# Patient Record
Sex: Male | Born: 1945 | Race: White | Hispanic: No | Marital: Married | Smoking: Never smoker
Health system: Southern US, Community
[De-identification: ages and names within clinical notes are randomized; demographics above are authoritative.]

## PROBLEM LIST (undated history)

## (undated) DIAGNOSIS — I10 Essential (primary) hypertension: Secondary | ICD-10-CM

## (undated) DIAGNOSIS — J302 Other seasonal allergic rhinitis: Secondary | ICD-10-CM

## (undated) HISTORY — PX: OTHER SURGICAL HISTORY: SHX169

---

## 2006-06-02 ENCOUNTER — Ambulatory Visit (HOSPITAL_COMMUNITY): Admission: RE | Admit: 2006-06-02 | Discharge: 2006-06-02 | Payer: Self-pay | Admitting: Family Medicine

## 2012-01-23 DIAGNOSIS — R079 Chest pain, unspecified: Secondary | ICD-10-CM

## 2012-06-01 DIAGNOSIS — H40019 Open angle with borderline findings, low risk, unspecified eye: Secondary | ICD-10-CM | POA: Diagnosis not present

## 2012-06-01 DIAGNOSIS — H2510 Age-related nuclear cataract, unspecified eye: Secondary | ICD-10-CM | POA: Diagnosis not present

## 2012-06-01 DIAGNOSIS — H40009 Preglaucoma, unspecified, unspecified eye: Secondary | ICD-10-CM | POA: Diagnosis not present

## 2013-01-25 DIAGNOSIS — I10 Essential (primary) hypertension: Secondary | ICD-10-CM | POA: Diagnosis not present

## 2013-01-25 DIAGNOSIS — E669 Obesity, unspecified: Secondary | ICD-10-CM | POA: Diagnosis not present

## 2013-01-25 DIAGNOSIS — L259 Unspecified contact dermatitis, unspecified cause: Secondary | ICD-10-CM | POA: Diagnosis not present

## 2013-01-31 DIAGNOSIS — E669 Obesity, unspecified: Secondary | ICD-10-CM | POA: Diagnosis not present

## 2013-01-31 DIAGNOSIS — I10 Essential (primary) hypertension: Secondary | ICD-10-CM | POA: Diagnosis not present

## 2013-01-31 DIAGNOSIS — R7309 Other abnormal glucose: Secondary | ICD-10-CM | POA: Diagnosis not present

## 2013-03-03 DIAGNOSIS — L570 Actinic keratosis: Secondary | ICD-10-CM | POA: Diagnosis not present

## 2013-03-03 DIAGNOSIS — L259 Unspecified contact dermatitis, unspecified cause: Secondary | ICD-10-CM | POA: Diagnosis not present

## 2013-03-22 DIAGNOSIS — I10 Essential (primary) hypertension: Secondary | ICD-10-CM | POA: Diagnosis not present

## 2013-03-22 DIAGNOSIS — E669 Obesity, unspecified: Secondary | ICD-10-CM | POA: Diagnosis not present

## 2013-03-22 DIAGNOSIS — Z23 Encounter for immunization: Secondary | ICD-10-CM | POA: Diagnosis not present

## 2013-08-08 DIAGNOSIS — I10 Essential (primary) hypertension: Secondary | ICD-10-CM | POA: Diagnosis not present

## 2013-08-08 DIAGNOSIS — IMO0001 Reserved for inherently not codable concepts without codable children: Secondary | ICD-10-CM | POA: Diagnosis not present

## 2013-08-31 DIAGNOSIS — K089 Disorder of teeth and supporting structures, unspecified: Secondary | ICD-10-CM | POA: Diagnosis not present

## 2013-08-31 DIAGNOSIS — I10 Essential (primary) hypertension: Secondary | ICD-10-CM | POA: Diagnosis not present

## 2013-11-01 DIAGNOSIS — F410 Panic disorder [episodic paroxysmal anxiety] without agoraphobia: Secondary | ICD-10-CM | POA: Diagnosis not present

## 2013-11-01 DIAGNOSIS — E669 Obesity, unspecified: Secondary | ICD-10-CM | POA: Diagnosis not present

## 2013-12-01 DIAGNOSIS — D1801 Hemangioma of skin and subcutaneous tissue: Secondary | ICD-10-CM | POA: Diagnosis not present

## 2013-12-01 DIAGNOSIS — L821 Other seborrheic keratosis: Secondary | ICD-10-CM | POA: Diagnosis not present

## 2013-12-01 DIAGNOSIS — L723 Sebaceous cyst: Secondary | ICD-10-CM | POA: Diagnosis not present

## 2014-01-31 DIAGNOSIS — I10 Essential (primary) hypertension: Secondary | ICD-10-CM | POA: Diagnosis not present

## 2014-03-09 DIAGNOSIS — L259 Unspecified contact dermatitis, unspecified cause: Secondary | ICD-10-CM | POA: Diagnosis not present

## 2014-04-16 DIAGNOSIS — Z23 Encounter for immunization: Secondary | ICD-10-CM | POA: Diagnosis not present

## 2014-04-26 DIAGNOSIS — L259 Unspecified contact dermatitis, unspecified cause: Secondary | ICD-10-CM | POA: Diagnosis not present

## 2014-04-26 DIAGNOSIS — J310 Chronic rhinitis: Secondary | ICD-10-CM | POA: Diagnosis not present

## 2014-05-05 DIAGNOSIS — S30861A Insect bite (nonvenomous) of abdominal wall, initial encounter: Secondary | ICD-10-CM | POA: Diagnosis not present

## 2014-05-05 DIAGNOSIS — W57XXXA Bitten or stung by nonvenomous insect and other nonvenomous arthropods, initial encounter: Secondary | ICD-10-CM | POA: Diagnosis not present

## 2014-05-24 DIAGNOSIS — J310 Chronic rhinitis: Secondary | ICD-10-CM | POA: Diagnosis not present

## 2014-05-24 DIAGNOSIS — L259 Unspecified contact dermatitis, unspecified cause: Secondary | ICD-10-CM | POA: Diagnosis not present

## 2014-06-21 DIAGNOSIS — I10 Essential (primary) hypertension: Secondary | ICD-10-CM | POA: Diagnosis not present

## 2014-06-21 DIAGNOSIS — E669 Obesity, unspecified: Secondary | ICD-10-CM | POA: Diagnosis not present

## 2014-06-21 DIAGNOSIS — K219 Gastro-esophageal reflux disease without esophagitis: Secondary | ICD-10-CM | POA: Diagnosis not present

## 2014-07-12 DIAGNOSIS — K0500 Acute gingivitis, plaque induced: Secondary | ICD-10-CM | POA: Diagnosis not present

## 2014-07-12 DIAGNOSIS — J069 Acute upper respiratory infection, unspecified: Secondary | ICD-10-CM | POA: Diagnosis not present

## 2015-03-21 DIAGNOSIS — R51 Headache: Secondary | ICD-10-CM | POA: Diagnosis not present

## 2015-03-21 DIAGNOSIS — Z23 Encounter for immunization: Secondary | ICD-10-CM | POA: Diagnosis not present

## 2015-03-21 DIAGNOSIS — I10 Essential (primary) hypertension: Secondary | ICD-10-CM | POA: Diagnosis not present

## 2015-10-23 DIAGNOSIS — I10 Essential (primary) hypertension: Secondary | ICD-10-CM | POA: Diagnosis not present

## 2015-10-23 DIAGNOSIS — M549 Dorsalgia, unspecified: Secondary | ICD-10-CM | POA: Diagnosis not present

## 2015-10-23 DIAGNOSIS — K5900 Constipation, unspecified: Secondary | ICD-10-CM | POA: Diagnosis not present

## 2015-10-31 DIAGNOSIS — M9903 Segmental and somatic dysfunction of lumbar region: Secondary | ICD-10-CM | POA: Diagnosis not present

## 2015-10-31 DIAGNOSIS — M9901 Segmental and somatic dysfunction of cervical region: Secondary | ICD-10-CM | POA: Diagnosis not present

## 2015-10-31 DIAGNOSIS — M9902 Segmental and somatic dysfunction of thoracic region: Secondary | ICD-10-CM | POA: Diagnosis not present

## 2015-10-31 DIAGNOSIS — I973 Postprocedural hypertension: Secondary | ICD-10-CM | POA: Diagnosis not present

## 2015-10-31 DIAGNOSIS — M5137 Other intervertebral disc degeneration, lumbosacral region: Secondary | ICD-10-CM | POA: Diagnosis not present

## 2015-11-04 DIAGNOSIS — M9901 Segmental and somatic dysfunction of cervical region: Secondary | ICD-10-CM | POA: Diagnosis not present

## 2015-11-04 DIAGNOSIS — I973 Postprocedural hypertension: Secondary | ICD-10-CM | POA: Diagnosis not present

## 2015-11-04 DIAGNOSIS — M9903 Segmental and somatic dysfunction of lumbar region: Secondary | ICD-10-CM | POA: Diagnosis not present

## 2015-11-04 DIAGNOSIS — M5137 Other intervertebral disc degeneration, lumbosacral region: Secondary | ICD-10-CM | POA: Diagnosis not present

## 2015-11-04 DIAGNOSIS — M9902 Segmental and somatic dysfunction of thoracic region: Secondary | ICD-10-CM | POA: Diagnosis not present

## 2015-11-06 DIAGNOSIS — I973 Postprocedural hypertension: Secondary | ICD-10-CM | POA: Diagnosis not present

## 2015-11-06 DIAGNOSIS — M9903 Segmental and somatic dysfunction of lumbar region: Secondary | ICD-10-CM | POA: Diagnosis not present

## 2015-11-06 DIAGNOSIS — M5137 Other intervertebral disc degeneration, lumbosacral region: Secondary | ICD-10-CM | POA: Diagnosis not present

## 2015-11-06 DIAGNOSIS — M9901 Segmental and somatic dysfunction of cervical region: Secondary | ICD-10-CM | POA: Diagnosis not present

## 2015-11-06 DIAGNOSIS — M9902 Segmental and somatic dysfunction of thoracic region: Secondary | ICD-10-CM | POA: Diagnosis not present

## 2015-11-07 DIAGNOSIS — M9903 Segmental and somatic dysfunction of lumbar region: Secondary | ICD-10-CM | POA: Diagnosis not present

## 2015-11-07 DIAGNOSIS — M9902 Segmental and somatic dysfunction of thoracic region: Secondary | ICD-10-CM | POA: Diagnosis not present

## 2015-11-07 DIAGNOSIS — M9901 Segmental and somatic dysfunction of cervical region: Secondary | ICD-10-CM | POA: Diagnosis not present

## 2015-11-07 DIAGNOSIS — I973 Postprocedural hypertension: Secondary | ICD-10-CM | POA: Diagnosis not present

## 2015-11-07 DIAGNOSIS — M5137 Other intervertebral disc degeneration, lumbosacral region: Secondary | ICD-10-CM | POA: Diagnosis not present

## 2015-11-12 DIAGNOSIS — M9901 Segmental and somatic dysfunction of cervical region: Secondary | ICD-10-CM | POA: Diagnosis not present

## 2015-11-12 DIAGNOSIS — M9902 Segmental and somatic dysfunction of thoracic region: Secondary | ICD-10-CM | POA: Diagnosis not present

## 2015-11-12 DIAGNOSIS — I973 Postprocedural hypertension: Secondary | ICD-10-CM | POA: Diagnosis not present

## 2015-11-12 DIAGNOSIS — M5137 Other intervertebral disc degeneration, lumbosacral region: Secondary | ICD-10-CM | POA: Diagnosis not present

## 2015-11-12 DIAGNOSIS — M9903 Segmental and somatic dysfunction of lumbar region: Secondary | ICD-10-CM | POA: Diagnosis not present

## 2015-11-13 DIAGNOSIS — M5127 Other intervertebral disc displacement, lumbosacral region: Secondary | ICD-10-CM | POA: Diagnosis not present

## 2015-11-13 DIAGNOSIS — M9903 Segmental and somatic dysfunction of lumbar region: Secondary | ICD-10-CM | POA: Diagnosis not present

## 2015-11-13 DIAGNOSIS — M9902 Segmental and somatic dysfunction of thoracic region: Secondary | ICD-10-CM | POA: Diagnosis not present

## 2015-11-13 DIAGNOSIS — M9904 Segmental and somatic dysfunction of sacral region: Secondary | ICD-10-CM | POA: Diagnosis not present

## 2015-11-13 DIAGNOSIS — M4317 Spondylolisthesis, lumbosacral region: Secondary | ICD-10-CM | POA: Diagnosis not present

## 2015-11-13 DIAGNOSIS — M5135 Other intervertebral disc degeneration, thoracolumbar region: Secondary | ICD-10-CM | POA: Diagnosis not present

## 2015-11-15 DIAGNOSIS — M5135 Other intervertebral disc degeneration, thoracolumbar region: Secondary | ICD-10-CM | POA: Diagnosis not present

## 2015-11-15 DIAGNOSIS — M9903 Segmental and somatic dysfunction of lumbar region: Secondary | ICD-10-CM | POA: Diagnosis not present

## 2015-11-15 DIAGNOSIS — M4317 Spondylolisthesis, lumbosacral region: Secondary | ICD-10-CM | POA: Diagnosis not present

## 2015-11-15 DIAGNOSIS — M9904 Segmental and somatic dysfunction of sacral region: Secondary | ICD-10-CM | POA: Diagnosis not present

## 2015-11-15 DIAGNOSIS — M9902 Segmental and somatic dysfunction of thoracic region: Secondary | ICD-10-CM | POA: Diagnosis not present

## 2015-11-15 DIAGNOSIS — M5127 Other intervertebral disc displacement, lumbosacral region: Secondary | ICD-10-CM | POA: Diagnosis not present

## 2015-11-18 DIAGNOSIS — M9903 Segmental and somatic dysfunction of lumbar region: Secondary | ICD-10-CM | POA: Diagnosis not present

## 2015-11-18 DIAGNOSIS — M5135 Other intervertebral disc degeneration, thoracolumbar region: Secondary | ICD-10-CM | POA: Diagnosis not present

## 2015-11-18 DIAGNOSIS — M9902 Segmental and somatic dysfunction of thoracic region: Secondary | ICD-10-CM | POA: Diagnosis not present

## 2015-11-18 DIAGNOSIS — M5127 Other intervertebral disc displacement, lumbosacral region: Secondary | ICD-10-CM | POA: Diagnosis not present

## 2015-11-18 DIAGNOSIS — M4317 Spondylolisthesis, lumbosacral region: Secondary | ICD-10-CM | POA: Diagnosis not present

## 2015-11-18 DIAGNOSIS — M9904 Segmental and somatic dysfunction of sacral region: Secondary | ICD-10-CM | POA: Diagnosis not present

## 2015-11-19 DIAGNOSIS — I10 Essential (primary) hypertension: Secondary | ICD-10-CM | POA: Diagnosis not present

## 2015-11-19 DIAGNOSIS — L089 Local infection of the skin and subcutaneous tissue, unspecified: Secondary | ICD-10-CM | POA: Diagnosis not present

## 2015-11-22 DIAGNOSIS — M9902 Segmental and somatic dysfunction of thoracic region: Secondary | ICD-10-CM | POA: Diagnosis not present

## 2015-11-22 DIAGNOSIS — M9903 Segmental and somatic dysfunction of lumbar region: Secondary | ICD-10-CM | POA: Diagnosis not present

## 2015-11-22 DIAGNOSIS — M4317 Spondylolisthesis, lumbosacral region: Secondary | ICD-10-CM | POA: Diagnosis not present

## 2015-11-22 DIAGNOSIS — M5127 Other intervertebral disc displacement, lumbosacral region: Secondary | ICD-10-CM | POA: Diagnosis not present

## 2015-11-22 DIAGNOSIS — M9904 Segmental and somatic dysfunction of sacral region: Secondary | ICD-10-CM | POA: Diagnosis not present

## 2015-11-22 DIAGNOSIS — M5135 Other intervertebral disc degeneration, thoracolumbar region: Secondary | ICD-10-CM | POA: Diagnosis not present

## 2015-11-25 DIAGNOSIS — M5135 Other intervertebral disc degeneration, thoracolumbar region: Secondary | ICD-10-CM | POA: Diagnosis not present

## 2015-11-25 DIAGNOSIS — M9904 Segmental and somatic dysfunction of sacral region: Secondary | ICD-10-CM | POA: Diagnosis not present

## 2015-11-25 DIAGNOSIS — M5127 Other intervertebral disc displacement, lumbosacral region: Secondary | ICD-10-CM | POA: Diagnosis not present

## 2015-11-25 DIAGNOSIS — M9903 Segmental and somatic dysfunction of lumbar region: Secondary | ICD-10-CM | POA: Diagnosis not present

## 2015-11-25 DIAGNOSIS — M9902 Segmental and somatic dysfunction of thoracic region: Secondary | ICD-10-CM | POA: Diagnosis not present

## 2015-11-25 DIAGNOSIS — M4317 Spondylolisthesis, lumbosacral region: Secondary | ICD-10-CM | POA: Diagnosis not present

## 2015-11-28 ENCOUNTER — Encounter (INDEPENDENT_AMBULATORY_CARE_PROVIDER_SITE_OTHER): Payer: Self-pay | Admitting: *Deleted

## 2015-11-28 DIAGNOSIS — I10 Essential (primary) hypertension: Secondary | ICD-10-CM | POA: Diagnosis not present

## 2015-11-28 DIAGNOSIS — K5900 Constipation, unspecified: Secondary | ICD-10-CM | POA: Diagnosis not present

## 2015-11-28 DIAGNOSIS — R220 Localized swelling, mass and lump, head: Secondary | ICD-10-CM | POA: Diagnosis not present

## 2015-11-29 DIAGNOSIS — M4317 Spondylolisthesis, lumbosacral region: Secondary | ICD-10-CM | POA: Diagnosis not present

## 2015-11-29 DIAGNOSIS — M5127 Other intervertebral disc displacement, lumbosacral region: Secondary | ICD-10-CM | POA: Diagnosis not present

## 2015-11-29 DIAGNOSIS — M9903 Segmental and somatic dysfunction of lumbar region: Secondary | ICD-10-CM | POA: Diagnosis not present

## 2015-11-29 DIAGNOSIS — M9902 Segmental and somatic dysfunction of thoracic region: Secondary | ICD-10-CM | POA: Diagnosis not present

## 2015-11-29 DIAGNOSIS — M5135 Other intervertebral disc degeneration, thoracolumbar region: Secondary | ICD-10-CM | POA: Diagnosis not present

## 2015-11-29 DIAGNOSIS — M9904 Segmental and somatic dysfunction of sacral region: Secondary | ICD-10-CM | POA: Diagnosis not present

## 2015-12-02 DIAGNOSIS — M5135 Other intervertebral disc degeneration, thoracolumbar region: Secondary | ICD-10-CM | POA: Diagnosis not present

## 2015-12-02 DIAGNOSIS — M9902 Segmental and somatic dysfunction of thoracic region: Secondary | ICD-10-CM | POA: Diagnosis not present

## 2015-12-02 DIAGNOSIS — M5127 Other intervertebral disc displacement, lumbosacral region: Secondary | ICD-10-CM | POA: Diagnosis not present

## 2015-12-02 DIAGNOSIS — M4317 Spondylolisthesis, lumbosacral region: Secondary | ICD-10-CM | POA: Diagnosis not present

## 2015-12-02 DIAGNOSIS — M9903 Segmental and somatic dysfunction of lumbar region: Secondary | ICD-10-CM | POA: Diagnosis not present

## 2015-12-02 DIAGNOSIS — M9904 Segmental and somatic dysfunction of sacral region: Secondary | ICD-10-CM | POA: Diagnosis not present

## 2015-12-06 DIAGNOSIS — M9903 Segmental and somatic dysfunction of lumbar region: Secondary | ICD-10-CM | POA: Diagnosis not present

## 2015-12-06 DIAGNOSIS — M5135 Other intervertebral disc degeneration, thoracolumbar region: Secondary | ICD-10-CM | POA: Diagnosis not present

## 2015-12-06 DIAGNOSIS — M9904 Segmental and somatic dysfunction of sacral region: Secondary | ICD-10-CM | POA: Diagnosis not present

## 2015-12-06 DIAGNOSIS — M9902 Segmental and somatic dysfunction of thoracic region: Secondary | ICD-10-CM | POA: Diagnosis not present

## 2015-12-06 DIAGNOSIS — M4317 Spondylolisthesis, lumbosacral region: Secondary | ICD-10-CM | POA: Diagnosis not present

## 2015-12-06 DIAGNOSIS — M5127 Other intervertebral disc displacement, lumbosacral region: Secondary | ICD-10-CM | POA: Diagnosis not present

## 2015-12-09 DIAGNOSIS — M9904 Segmental and somatic dysfunction of sacral region: Secondary | ICD-10-CM | POA: Diagnosis not present

## 2015-12-09 DIAGNOSIS — M9902 Segmental and somatic dysfunction of thoracic region: Secondary | ICD-10-CM | POA: Diagnosis not present

## 2015-12-09 DIAGNOSIS — M5127 Other intervertebral disc displacement, lumbosacral region: Secondary | ICD-10-CM | POA: Diagnosis not present

## 2015-12-09 DIAGNOSIS — M9903 Segmental and somatic dysfunction of lumbar region: Secondary | ICD-10-CM | POA: Diagnosis not present

## 2015-12-09 DIAGNOSIS — M4317 Spondylolisthesis, lumbosacral region: Secondary | ICD-10-CM | POA: Diagnosis not present

## 2015-12-09 DIAGNOSIS — M5135 Other intervertebral disc degeneration, thoracolumbar region: Secondary | ICD-10-CM | POA: Diagnosis not present

## 2015-12-12 ENCOUNTER — Other Ambulatory Visit (INDEPENDENT_AMBULATORY_CARE_PROVIDER_SITE_OTHER): Payer: Self-pay | Admitting: *Deleted

## 2015-12-12 DIAGNOSIS — Z1211 Encounter for screening for malignant neoplasm of colon: Secondary | ICD-10-CM

## 2015-12-13 DIAGNOSIS — M5135 Other intervertebral disc degeneration, thoracolumbar region: Secondary | ICD-10-CM | POA: Diagnosis not present

## 2015-12-13 DIAGNOSIS — M9904 Segmental and somatic dysfunction of sacral region: Secondary | ICD-10-CM | POA: Diagnosis not present

## 2015-12-13 DIAGNOSIS — M5127 Other intervertebral disc displacement, lumbosacral region: Secondary | ICD-10-CM | POA: Diagnosis not present

## 2015-12-13 DIAGNOSIS — M9903 Segmental and somatic dysfunction of lumbar region: Secondary | ICD-10-CM | POA: Diagnosis not present

## 2015-12-13 DIAGNOSIS — M4317 Spondylolisthesis, lumbosacral region: Secondary | ICD-10-CM | POA: Diagnosis not present

## 2015-12-13 DIAGNOSIS — M9902 Segmental and somatic dysfunction of thoracic region: Secondary | ICD-10-CM | POA: Diagnosis not present

## 2015-12-19 DIAGNOSIS — M9903 Segmental and somatic dysfunction of lumbar region: Secondary | ICD-10-CM | POA: Diagnosis not present

## 2015-12-19 DIAGNOSIS — M4317 Spondylolisthesis, lumbosacral region: Secondary | ICD-10-CM | POA: Diagnosis not present

## 2015-12-19 DIAGNOSIS — M9904 Segmental and somatic dysfunction of sacral region: Secondary | ICD-10-CM | POA: Diagnosis not present

## 2015-12-19 DIAGNOSIS — M5135 Other intervertebral disc degeneration, thoracolumbar region: Secondary | ICD-10-CM | POA: Diagnosis not present

## 2015-12-19 DIAGNOSIS — M5127 Other intervertebral disc displacement, lumbosacral region: Secondary | ICD-10-CM | POA: Diagnosis not present

## 2015-12-19 DIAGNOSIS — M9902 Segmental and somatic dysfunction of thoracic region: Secondary | ICD-10-CM | POA: Diagnosis not present

## 2015-12-24 DIAGNOSIS — L723 Sebaceous cyst: Secondary | ICD-10-CM | POA: Diagnosis not present

## 2015-12-25 DIAGNOSIS — M5127 Other intervertebral disc displacement, lumbosacral region: Secondary | ICD-10-CM | POA: Diagnosis not present

## 2015-12-25 DIAGNOSIS — M5135 Other intervertebral disc degeneration, thoracolumbar region: Secondary | ICD-10-CM | POA: Diagnosis not present

## 2015-12-25 DIAGNOSIS — M9904 Segmental and somatic dysfunction of sacral region: Secondary | ICD-10-CM | POA: Diagnosis not present

## 2015-12-25 DIAGNOSIS — M4317 Spondylolisthesis, lumbosacral region: Secondary | ICD-10-CM | POA: Diagnosis not present

## 2015-12-25 DIAGNOSIS — M9903 Segmental and somatic dysfunction of lumbar region: Secondary | ICD-10-CM | POA: Diagnosis not present

## 2015-12-25 DIAGNOSIS — M9902 Segmental and somatic dysfunction of thoracic region: Secondary | ICD-10-CM | POA: Diagnosis not present

## 2016-01-01 DIAGNOSIS — M9904 Segmental and somatic dysfunction of sacral region: Secondary | ICD-10-CM | POA: Diagnosis not present

## 2016-01-01 DIAGNOSIS — M9902 Segmental and somatic dysfunction of thoracic region: Secondary | ICD-10-CM | POA: Diagnosis not present

## 2016-01-01 DIAGNOSIS — M9903 Segmental and somatic dysfunction of lumbar region: Secondary | ICD-10-CM | POA: Diagnosis not present

## 2016-01-01 DIAGNOSIS — M5135 Other intervertebral disc degeneration, thoracolumbar region: Secondary | ICD-10-CM | POA: Diagnosis not present

## 2016-01-01 DIAGNOSIS — M4317 Spondylolisthesis, lumbosacral region: Secondary | ICD-10-CM | POA: Diagnosis not present

## 2016-01-01 DIAGNOSIS — M5127 Other intervertebral disc displacement, lumbosacral region: Secondary | ICD-10-CM | POA: Diagnosis not present

## 2016-01-07 DIAGNOSIS — M4317 Spondylolisthesis, lumbosacral region: Secondary | ICD-10-CM | POA: Diagnosis not present

## 2016-01-07 DIAGNOSIS — M9902 Segmental and somatic dysfunction of thoracic region: Secondary | ICD-10-CM | POA: Diagnosis not present

## 2016-01-07 DIAGNOSIS — M9903 Segmental and somatic dysfunction of lumbar region: Secondary | ICD-10-CM | POA: Diagnosis not present

## 2016-01-07 DIAGNOSIS — M9904 Segmental and somatic dysfunction of sacral region: Secondary | ICD-10-CM | POA: Diagnosis not present

## 2016-01-07 DIAGNOSIS — M5135 Other intervertebral disc degeneration, thoracolumbar region: Secondary | ICD-10-CM | POA: Diagnosis not present

## 2016-01-07 DIAGNOSIS — M5127 Other intervertebral disc displacement, lumbosacral region: Secondary | ICD-10-CM | POA: Diagnosis not present

## 2016-01-14 DIAGNOSIS — M5127 Other intervertebral disc displacement, lumbosacral region: Secondary | ICD-10-CM | POA: Diagnosis not present

## 2016-01-14 DIAGNOSIS — M9904 Segmental and somatic dysfunction of sacral region: Secondary | ICD-10-CM | POA: Diagnosis not present

## 2016-01-14 DIAGNOSIS — M4317 Spondylolisthesis, lumbosacral region: Secondary | ICD-10-CM | POA: Diagnosis not present

## 2016-01-14 DIAGNOSIS — M9902 Segmental and somatic dysfunction of thoracic region: Secondary | ICD-10-CM | POA: Diagnosis not present

## 2016-01-14 DIAGNOSIS — M5135 Other intervertebral disc degeneration, thoracolumbar region: Secondary | ICD-10-CM | POA: Diagnosis not present

## 2016-01-14 DIAGNOSIS — M9903 Segmental and somatic dysfunction of lumbar region: Secondary | ICD-10-CM | POA: Diagnosis not present

## 2016-01-29 DIAGNOSIS — I10 Essential (primary) hypertension: Secondary | ICD-10-CM | POA: Diagnosis not present

## 2016-01-29 DIAGNOSIS — E785 Hyperlipidemia, unspecified: Secondary | ICD-10-CM | POA: Diagnosis not present

## 2016-01-29 DIAGNOSIS — Z6835 Body mass index (BMI) 35.0-35.9, adult: Secondary | ICD-10-CM | POA: Diagnosis not present

## 2016-01-31 DIAGNOSIS — L853 Xerosis cutis: Secondary | ICD-10-CM | POA: Diagnosis not present

## 2016-01-31 DIAGNOSIS — L570 Actinic keratosis: Secondary | ICD-10-CM | POA: Diagnosis not present

## 2016-01-31 DIAGNOSIS — D692 Other nonthrombocytopenic purpura: Secondary | ICD-10-CM | POA: Diagnosis not present

## 2016-02-04 DIAGNOSIS — H401130 Primary open-angle glaucoma, bilateral, stage unspecified: Secondary | ICD-10-CM | POA: Diagnosis not present

## 2016-02-04 DIAGNOSIS — H2513 Age-related nuclear cataract, bilateral: Secondary | ICD-10-CM | POA: Diagnosis not present

## 2016-02-20 ENCOUNTER — Telehealth (INDEPENDENT_AMBULATORY_CARE_PROVIDER_SITE_OTHER): Payer: Self-pay | Admitting: *Deleted

## 2016-02-20 ENCOUNTER — Encounter (INDEPENDENT_AMBULATORY_CARE_PROVIDER_SITE_OTHER): Payer: Self-pay | Admitting: *Deleted

## 2016-02-20 NOTE — Telephone Encounter (Signed)
Patient needs trilyte 

## 2016-02-21 MED ORDER — PEG 3350-KCL-NA BICARB-NACL 420 G PO SOLR: 4000.0000 mL | Freq: Once | ORAL | 0 refills | Status: AC

## 2016-03-05 ENCOUNTER — Telehealth (INDEPENDENT_AMBULATORY_CARE_PROVIDER_SITE_OTHER): Payer: Self-pay | Admitting: *Deleted

## 2016-03-05 NOTE — Telephone Encounter (Signed)
Referring MD/PCP: fanta   Procedure: tcs  Reason/Indication:  screening  Has patient had this procedure before?  no  If so, when, by whom and where?    Is there a family history of colon cancer?  no  Who?  What age when diagnosed?    Is patient diabetic?   no      Does patient have prosthetic heart valve or mechanical valve?  no  Do you have a pacemaker?  no  Has patient ever had endocarditis? no  Has patient had joint replacement within last 12 months?  no  Does patient tend to be constipated or take laxatives? no  Does patient have a history of alcohol/drug use?  no  Is patient on Coumadin, Plavix and/or Aspirin? no  Medications: quinapril 10 mg daily  Allergies: nkda  Medication Adjustment:   Procedure date & time: 04/01/16 at 730

## 2016-03-06 NOTE — Telephone Encounter (Signed)
agree

## 2016-03-30 ENCOUNTER — Other Ambulatory Visit (INDEPENDENT_AMBULATORY_CARE_PROVIDER_SITE_OTHER): Payer: Self-pay | Admitting: *Deleted

## 2016-03-30 DIAGNOSIS — Z1211 Encounter for screening for malignant neoplasm of colon: Secondary | ICD-10-CM

## 2016-04-01 ENCOUNTER — Ambulatory Visit (HOSPITAL_COMMUNITY)
Admission: RE | Admit: 2016-04-01 | Discharge: 2016-04-01 | Disposition: A | Discharge status: Home / Self Care | Payer: Medicare Other | Source: Ambulatory Visit | Attending: Internal Medicine | Admitting: Internal Medicine

## 2016-04-01 ENCOUNTER — Encounter (HOSPITAL_COMMUNITY)
Admission: RE | Disposition: A | Discharge status: Home / Self Care | Payer: Self-pay | Source: Ambulatory Visit | Attending: Internal Medicine

## 2016-04-01 ENCOUNTER — Encounter (HOSPITAL_COMMUNITY): Payer: Self-pay | Admitting: *Deleted

## 2016-04-01 DIAGNOSIS — K5730 Diverticulosis of large intestine without perforation or abscess without bleeding: Secondary | ICD-10-CM | POA: Insufficient documentation

## 2016-04-01 DIAGNOSIS — Z79899 Other long term (current) drug therapy: Secondary | ICD-10-CM | POA: Insufficient documentation

## 2016-04-01 DIAGNOSIS — K644 Residual hemorrhoidal skin tags: Secondary | ICD-10-CM | POA: Insufficient documentation

## 2016-04-01 DIAGNOSIS — Z1211 Encounter for screening for malignant neoplasm of colon: Secondary | ICD-10-CM | POA: Diagnosis not present

## 2016-04-01 HISTORY — PX: COLONOSCOPY: SHX5424

## 2016-04-01 HISTORY — DX: Other seasonal allergic rhinitis: J30.2

## 2016-04-01 SURGERY — COLONOSCOPY
Anesthesia: Moderate Sedation

## 2016-04-01 MED ORDER — MIDAZOLAM HCL 5 MG/5ML IJ SOLN
INTRAMUSCULAR | Status: DC | PRN
  Administered 2016-04-01 (×2): 2 mg via INTRAVENOUS
  Administered 2016-04-01: 3 mg via INTRAVENOUS
  Administered 2016-04-01: 2 mg via INTRAVENOUS

## 2016-04-01 MED ORDER — MIDAZOLAM HCL 5 MG/5ML IJ SOLN
INTRAMUSCULAR | Status: AC
  Filled 2016-04-01: qty 10

## 2016-04-01 MED ORDER — MEPERIDINE HCL 50 MG/ML IJ SOLN
INTRAMUSCULAR | Status: AC
  Filled 2016-04-01: qty 1

## 2016-04-01 MED ORDER — SODIUM CHLORIDE 0.9 % IV SOLN
INTRAVENOUS | Status: DC
  Administered 2016-04-01: 08:00:00 via INTRAVENOUS

## 2016-04-01 MED ORDER — MEPERIDINE HCL 50 MG/ML IJ SOLN
INTRAMUSCULAR | Status: DC | PRN
  Administered 2016-04-01 (×2): 25 mg

## 2016-04-01 NOTE — Discharge Instructions (Signed)
Resume usual medications and high fiber diet. No driving for 24 hours. Next screening exam in 10 years.   Colonoscopy, Care After These instructions give you information on caring for yourself after your procedure. Your doctor may also give you more specific instructions. Call your doctor if you have any problems or questions after your procedure. HOME CARE  Do not drive for 24 hours.  Do not sign important papers or use machinery for 24 hours.  You may shower.  You may go back to your usual activities, but go slower for the first 24 hours.  Take rest breaks often during the first 24 hours.  Walk around or use warm packs on your belly (abdomen) if you have belly cramping or gas.  Drink enough fluids to keep your pee (urine) clear or pale yellow.  Resume your normal diet. Avoid heavy or fried foods.  Avoid drinking alcohol for 24 hours or as told by your doctor.  Only take medicines as told by your doctor. If a tissue sample (biopsy) was taken during the procedure:   Do not take aspirin or blood thinners for 7 days, or as told by your doctor.  Do not drink alcohol for 7 days, or as told by your doctor.  Eat soft foods for the first 24 hours. GET HELP IF: You still have a small amount of blood in your poop (stool) 2-3 days after the procedure. GET HELP RIGHT AWAY IF:  You have more than a small amount of blood in your poop.  You see clumps of tissue (blood clots) in your poop.  Your belly is puffy (swollen).  You feel sick to your stomach (nauseous) or throw up (vomit).  You have a fever.  You have belly pain that gets worse and medicine does not help. MAKE SURE YOU:  Understand these instructions.  Will watch your condition.  Will get help right away if you are not doing well or get worse.   This information is not intended to replace advice given to you by your health care provider. Make sure you discuss any questions you have with your health care  provider.   Document Released: 07/04/2010 Document Revised: 06/06/2013 Document Reviewed: 02/06/2013 Elsevier Interactive Patient Education 2016 [employer].    Hemorrhoids Hemorrhoids are swollen veins around the rectum or anus. There are two types of hemorrhoids:   Internal hemorrhoids. These occur in the veins just inside the rectum. They may poke through to the outside and become irritated and painful.  External hemorrhoids. These occur in the veins outside the anus and can be felt as a painful swelling or hard lump near the anus. CAUSES  Pregnancy.   Obesity.   Constipation or diarrhea.   Straining to have a bowel movement.   Sitting for long periods on the toilet.  Heavy lifting or other activity that caused you to strain.  Anal intercourse. SYMPTOMS   Pain.   Anal itching or irritation.   Rectal bleeding.   Fecal leakage.   Anal swelling.   One or more lumps around the anus.  DIAGNOSIS  Your caregiver may be able to diagnose hemorrhoids by visual examination. Other examinations or tests that may be performed include:   Examination of the rectal area with a gloved hand (digital rectal exam).   Examination of anal canal using a small tube (scope).   A blood test if you have lost a significant amount of blood.  A test to look inside the colon (sigmoidoscopy or colonoscopy).  TREATMENT Most hemorrhoids can be treated at home. However, if symptoms do not seem to be getting better or if you have a lot of rectal bleeding, your caregiver may perform a procedure to help make the hemorrhoids get smaller or remove them completely. Possible treatments include:   Placing a rubber band at the base of the hemorrhoid to cut off the circulation (rubber band ligation).   Injecting a chemical to shrink the hemorrhoid (sclerotherapy).   Using a tool to burn the hemorrhoid (infrared light therapy).   Surgically removing the hemorrhoid (hemorrhoidectomy).    Stapling the hemorrhoid to block blood flow to the tissue (hemorrhoid stapling).  HOME CARE INSTRUCTIONS   Eat foods with fiber, such as whole grains, beans, nuts, fruits, and vegetables. Ask your doctor about taking products with added fiber in them (fibersupplements).  Increase fluid intake. Drink enough water and fluids to keep your urine clear or pale yellow.   Exercise regularly.   Go to the bathroom when you have the urge to have a bowel movement. Do not wait.   Avoid straining to have bowel movements.   Keep the anal area dry and clean. Use wet toilet paper or moist towelettes after a bowel movement.   Medicated creams and suppositories may be used or applied as directed.   Only take over-the-counter or prescription medicines as directed by your caregiver.   Take warm sitz baths for 15-20 minutes, 3-4 times a day to ease pain and discomfort.   Place ice packs on the hemorrhoids if they are tender and swollen. Using ice packs between sitz baths may be helpful.   Put ice in a plastic bag.   Place a towel between your skin and the bag.   Leave the ice on for 15-20 minutes, 3-4 times a day.   Do not use a donut-shaped pillow or sit on the toilet for long periods. This increases blood pooling and pain.  SEEK MEDICAL CARE IF:  You have increasing pain and swelling that is not controlled by treatment or medicine.  You have uncontrolled bleeding.  You have difficulty or you are unable to have a bowel movement.  You have pain or inflammation outside the area of the hemorrhoids. MAKE SURE YOU:  Understand these instructions.  Will watch your condition.  Will get help right away if you are not doing well or get worse.   This information is not intended to replace advice given to you by your health care provider. Make sure you discuss any questions you have with your health care provider.   Document Released: 05/29/2000 Document Revised: 05/18/2012  Document Reviewed: 04/05/2012 Elsevier Interactive Patient Education 2016 [employer].   Diverticulosis Diverticulosis is the condition that develops when small pouches (diverticula) form in the wall of your colon. Your colon, or large intestine, is where water is absorbed and stool is formed. The pouches form when the inside layer of your colon pushes through weak spots in the outer layers of your colon. CAUSES  No one knows exactly what causes diverticulosis. RISK FACTORS  Being older than 38. Your risk for this condition increases with age. Diverticulosis is rare in people younger than 40 years. By age 55, almost everyone has it.  Eating a low-fiber diet.  Being frequently constipated.  Being overweight.  Not getting enough exercise.  Smoking.  Taking over-the-counter pain medicines, like aspirin and ibuprofen. SYMPTOMS  Most people with diverticulosis do not have symptoms. DIAGNOSIS  Because diverticulosis often has no symptoms, health  care providers often discover the condition during an exam for other colon problems. In many cases, a health care provider will diagnose diverticulosis while using a flexible scope to examine the colon (colonoscopy). TREATMENT  If you have never developed an infection related to diverticulosis, you may not need treatment. If you have had an infection before, treatment may include:  Eating more fruits, vegetables, and grains.  Taking a fiber supplement.  Taking a live bacteria supplement (probiotic).  Taking medicine to relax your colon. HOME CARE INSTRUCTIONS   Drink at least 6-8 glasses of water each day to prevent constipation.  Try not to strain when you have a bowel movement.  Keep all follow-up appointments. If you have had an infection before:  Increase the fiber in your diet as directed by your health care provider or dietitian.  Take a dietary fiber supplement if your health care provider approves.  Only take medicines  as directed by your health care provider. SEEK MEDICAL CARE IF:   You have abdominal pain.  You have bloating.  You have cramps.  You have not gone to the bathroom in 3 days. SEEK IMMEDIATE MEDICAL CARE IF:   Your pain gets worse.  Yourbloating becomes very bad.  You have a fever or chills, and your symptoms suddenly get worse.  You begin vomiting.  You have bowel movements that are bloody or black. MAKE SURE YOU:  Understand these instructions.  Will watch your condition.  Will get help right away if you are not doing well or get worse.   This information is not intended to replace advice given to you by your health care provider. Make sure you discuss any questions you have with your health care provider.   Document Released: 02/27/2004 Document Revised: 06/06/2013 Document Reviewed: 04/26/2013 Elsevier Interactive Patient Education [employer].

## 2016-04-01 NOTE — Op Note (Signed)
[facility] Patient Name: Johnny Gates Procedure Date: 04/01/2016 8:18 AM MRN: [id number] Date of Birth: [birth year] Attending MD: Hildred Laser , MD CSN: [id number] Age: 70 Admit Type: Outpatient Procedure:                Colonoscopy Indications:              Screening for colorectal malignant neoplasm Providers:                Hildred Laser, MD, Janeece Riggers, RN, Isabella Stalling,                            Technician Referring MD:             Rosita Fire, MD Medicines:                Meperidine 50 mg IV, Midazolam 8 mg IV Complications:            No immediate complications. Estimated Blood Loss:     Estimated blood loss: none. Procedure:                Pre-Anesthesia Assessment:                           - Prior to the procedure, a History and Physical                            was performed, and patient medications and                            allergies were reviewed. The patient's tolerance of                            previous anesthesia was also reviewed. The risks                            and benefits of the procedure and the sedation                            options and risks were discussed with the patient.                            All questions were answered, and informed consent                            was obtained. Prior Anticoagulants: The patient has                            taken no previous anticoagulant or antiplatelet                            agents. ASA Grade Assessment: I - A normal, healthy                            patient. After reviewing the risks and benefits,  the patient was deemed in satisfactory condition to                            undergo the procedure.                           After obtaining informed consent, the colonoscope                            was passed under direct vision. Throughout the                            procedure, the patient's blood pressure, pulse, and        oxygen saturations were monitored continuously. The                            EC-[id number]) scope was introduced through                            the anus and advanced to the the cecum, identified                            by appendiceal orifice and ileocecal valve. The                            colonoscopy was performed without difficulty. The                            patient tolerated the procedure well. The quality                            of the bowel preparation was adequate. The                            ileocecal valve, appendiceal orifice, and rectum                            were photographed. Scope In: 8:35:06 AM Scope Out: 8:56:32 AM Scope Withdrawal Time: 0 hours 8 minutes 26 seconds  Total Procedure Duration: 0 hours 21 minutes 26 seconds  Findings:      A few medium-mouthed diverticula were found in the sigmoid colon.      External hemorrhoids were found during retroflexion. The hemorrhoids       were medium-sized. Impression:               - Diverticulosis in the sigmoid colon.                           - External hemorrhoids.                           - No specimens collected. Moderate Sedation:      Moderate (conscious) sedation was administered by the endoscopy nurse       and supervised by the endoscopist. The following parameters were       monitored:  oxygen saturation, heart rate, blood pressure, CO2       capnography and response to care. Total physician intraservice time was       33 minutes. Recommendation:           - Patient has a contact number available for                            emergencies. The signs and symptoms of potential                            delayed complications were discussed with the                            patient. Return to normal activities tomorrow.                            Written discharge instructions were provided to the                            patient.                           - High fiber diet  today.                           - Continue present medications.                           - Repeat colonoscopy in 10 years for screening                            purposes. Procedure Code(s):        --- Professional ---                           [phone], Colonoscopy, flexible; diagnostic, including                            collection of specimen(s) by brushing or washing,                            when performed (separate procedure)                           99152, Moderate sedation services provided by the                            same physician or other qualified health care                            professional performing the diagnostic or                            therapeutic service that the sedation supports,  requiring the presence of an independent trained                            observer to assist in the monitoring of the                            patient's level of consciousness and physiological                            status; initial 15 minutes of intraservice time,                            patient age 75 years or older                           [phone], Moderate sedation services; each additional                            15 minutes intraservice time Diagnosis Code(s):        --- Professional ---                           Z12.11, Encounter for screening for malignant                            neoplasm of colon                           K64.4, Residual hemorrhoidal skin tags                           K57.30, Diverticulosis of large intestine without                            perforation or abscess without bleeding CPT copyright 2016 American Medical Association. All rights reserved. The codes documented in this report are preliminary and upon coder review may  be revised to meet current compliance requirements. Hildred Laser, MD Hildred Laser, MD 04/01/2016 9:03:34 AM This report has been signed electronically. Number of Addenda:  0

## 2016-04-01 NOTE — H&P (Addendum)
Johnny Gates is an 70 y.o. male.   Chief Complaint: Patient is here for colonoscopy. HPI: Patient is 70 year old Caucasian male who is here for screening colonoscopy. She is patient's first exam. He denies abdominal pain change in bowel habits of frank rectal bleeding. He has occasional hematochezia felt to be secondary to hemorrhoids. Family history is negative for CRC.  Past Medical History:  Diagnosis Date  . Seasonal allergies     Past Surgical History:  Procedure Laterality Date  . Left shoulder surgery      Family History  Problem Relation Age of Onset  . Colon cancer Neg Hx    Social History:  reports that he has never smoked. He has never used smokeless tobacco. He reports that he does not drink alcohol or use drugs.  Allergies: No Known Allergies  Medications Prior to Admission  Medication Sig Dispense Refill  . acetaminophen (TYLENOL) 500 MG tablet Take 500-1,000 mg by mouth every 6 (six) hours as needed (for pain.).    Marland Kitchen Ascorbic Acid (VITAMIN C) 1000 MG tablet Take 1,000 mg by mouth daily.    . cetirizine (ZYRTEC) 10 MG tablet Take 10 mg by mouth daily.    . Cholecalciferol (VITAMIN D3) 5000 units TABS Take 5,000 Units by mouth daily.    . clobetasol cream (TEMOVATE) [id number] % Apply 1 application topically 2 (two) times daily as needed (for itch/rash).    . Lactobacillus (ACIDOPHILUS PO) Take 1 capsule by mouth daily. 10 billion    . Misc Natural Products (CALCIUM PLUS ADVANCED PO) Take 3 tablets by mouth daily.    . Misc Natural Products (PROSTATE HEALTH PO) Take 2 capsules by mouth daily. Prostate Optimizer    . OVER THE COUNTER MEDICATION Take 1 tablet by mouth daily. Trace Minerals Calcium, Silicon, Selenium, Phosphorus, Iodine, Chromium, Manganese, Iron, Copper, Molybdenum, Zinc, Vanadium.    . traMADol-acetaminophen (ULTRACET) 37.5-325 MG tablet Take 1 tablet by mouth every 6 (six) hours as needed (for pain.).    Marland Kitchen vitamin A 25000 UNIT capsule Take 25,000 Units  by mouth daily.    Marland Kitchen zinc gluconate 50 MG tablet Take 50 mg by mouth daily.      No results found for this or any previous visit (from the past 48 hour(s)). No results found.  ROS  Blood pressure 127/73, pulse 73, temperature 98.5 F (36.9 C), temperature source Oral, resp. rate 16, height 5\' 9"  (1.753 m), weight 237 lb (107.5 kg), SpO2 100 %. Physical Exam  Constitutional: He appears well-developed and well-nourished.  HENT:  Mouth/Throat: Oropharynx is clear and moist.  Eyes: Conjunctivae are normal. No scleral icterus.  Neck: No thyromegaly present.  Cardiovascular: Normal rate, regular rhythm and normal heart sounds.   No murmur heard. Respiratory: Effort normal and breath sounds normal.  GI: Soft. He exhibits no distension and no mass. There is no tenderness.  Musculoskeletal: He exhibits no edema.  Lymphadenopathy:    He has no cervical adenopathy.  Neurological: He is alert.  Skin: Skin is warm and dry.     Assessment/Plan: Patient was screening colonoscopy.  Hildred Laser, MD 04/01/2016, 8:25 AM

## 2016-04-03 ENCOUNTER — Encounter (HOSPITAL_COMMUNITY): Payer: Self-pay | Admitting: Internal Medicine

## 2016-04-10 DIAGNOSIS — L720 Epidermal cyst: Secondary | ICD-10-CM | POA: Diagnosis not present

## 2016-04-10 DIAGNOSIS — L821 Other seborrheic keratosis: Secondary | ICD-10-CM | POA: Diagnosis not present

## 2016-04-10 DIAGNOSIS — L853 Xerosis cutis: Secondary | ICD-10-CM | POA: Diagnosis not present

## 2016-04-10 DIAGNOSIS — L570 Actinic keratosis: Secondary | ICD-10-CM | POA: Diagnosis not present

## 2016-06-09 ENCOUNTER — Other Ambulatory Visit (HOSPITAL_COMMUNITY): Payer: Self-pay | Admitting: Internal Medicine

## 2016-06-09 ENCOUNTER — Ambulatory Visit (HOSPITAL_COMMUNITY)
Admission: RE | Admit: 2016-06-09 | Discharge: 2016-06-09 | Disposition: A | Discharge status: Home / Self Care | Payer: Medicare Other | Source: Ambulatory Visit | Attending: Internal Medicine | Admitting: Internal Medicine

## 2016-06-09 DIAGNOSIS — M4316 Spondylolisthesis, lumbar region: Secondary | ICD-10-CM | POA: Diagnosis not present

## 2016-06-09 DIAGNOSIS — R51 Headache: Secondary | ICD-10-CM | POA: Diagnosis not present

## 2016-06-09 DIAGNOSIS — M549 Dorsalgia, unspecified: Secondary | ICD-10-CM | POA: Diagnosis present

## 2016-06-09 DIAGNOSIS — M1288 Other specific arthropathies, not elsewhere classified, other specified site: Secondary | ICD-10-CM | POA: Diagnosis not present

## 2016-06-09 DIAGNOSIS — I10 Essential (primary) hypertension: Secondary | ICD-10-CM | POA: Diagnosis not present

## 2016-06-09 DIAGNOSIS — M5136 Other intervertebral disc degeneration, lumbar region: Secondary | ICD-10-CM | POA: Diagnosis not present

## 2016-06-09 DIAGNOSIS — M545 Low back pain: Secondary | ICD-10-CM

## 2016-06-09 DIAGNOSIS — M5137 Other intervertebral disc degeneration, lumbosacral region: Secondary | ICD-10-CM | POA: Diagnosis not present

## 2016-12-08 DIAGNOSIS — F411 Generalized anxiety disorder: Secondary | ICD-10-CM | POA: Diagnosis not present

## 2016-12-08 DIAGNOSIS — M545 Low back pain: Secondary | ICD-10-CM | POA: Diagnosis not present

## 2017-02-12 DIAGNOSIS — I10 Essential (primary) hypertension: Secondary | ICD-10-CM | POA: Diagnosis not present

## 2017-02-12 DIAGNOSIS — W57XXXA Bitten or stung by nonvenomous insect and other nonvenomous arthropods, initial encounter: Secondary | ICD-10-CM | POA: Diagnosis not present

## 2017-02-12 DIAGNOSIS — L309 Dermatitis, unspecified: Secondary | ICD-10-CM | POA: Diagnosis not present

## 2017-02-22 DIAGNOSIS — H40013 Open angle with borderline findings, low risk, bilateral: Secondary | ICD-10-CM | POA: Diagnosis not present

## 2017-07-19 DIAGNOSIS — H40013 Open angle with borderline findings, low risk, bilateral: Secondary | ICD-10-CM | POA: Diagnosis not present

## 2017-07-19 DIAGNOSIS — H25813 Combined forms of age-related cataract, bilateral: Secondary | ICD-10-CM | POA: Diagnosis not present

## 2017-07-19 DIAGNOSIS — H25013 Cortical age-related cataract, bilateral: Secondary | ICD-10-CM | POA: Diagnosis not present

## 2017-07-19 DIAGNOSIS — H2513 Age-related nuclear cataract, bilateral: Secondary | ICD-10-CM | POA: Diagnosis not present

## 2017-12-10 DIAGNOSIS — L821 Other seborrheic keratosis: Secondary | ICD-10-CM | POA: Diagnosis not present

## 2017-12-10 DIAGNOSIS — L578 Other skin changes due to chronic exposure to nonionizing radiation: Secondary | ICD-10-CM | POA: Diagnosis not present

## 2017-12-10 DIAGNOSIS — L570 Actinic keratosis: Secondary | ICD-10-CM | POA: Diagnosis not present

## 2017-12-10 DIAGNOSIS — C44629 Squamous cell carcinoma of skin of left upper limb, including shoulder: Secondary | ICD-10-CM | POA: Diagnosis not present

## 2017-12-10 DIAGNOSIS — D485 Neoplasm of uncertain behavior of skin: Secondary | ICD-10-CM | POA: Diagnosis not present

## 2018-01-07 DIAGNOSIS — D0462 Carcinoma in situ of skin of left upper limb, including shoulder: Secondary | ICD-10-CM | POA: Diagnosis not present

## 2018-01-25 ENCOUNTER — Other Ambulatory Visit (HOSPITAL_COMMUNITY): Payer: Self-pay | Admitting: Internal Medicine

## 2018-01-25 DIAGNOSIS — R600 Localized edema: Secondary | ICD-10-CM | POA: Diagnosis not present

## 2018-01-25 DIAGNOSIS — M7989 Other specified soft tissue disorders: Principal | ICD-10-CM

## 2018-01-25 DIAGNOSIS — M79604 Pain in right leg: Secondary | ICD-10-CM | POA: Diagnosis not present

## 2018-01-25 DIAGNOSIS — M79661 Pain in right lower leg: Secondary | ICD-10-CM

## 2018-01-28 ENCOUNTER — Ambulatory Visit (HOSPITAL_COMMUNITY)
Admission: RE | Admit: 2018-01-28 | Discharge: 2018-01-28 | Disposition: A | Discharge status: Home / Self Care | Payer: Medicare Other | Source: Ambulatory Visit | Attending: Internal Medicine | Admitting: Internal Medicine

## 2018-01-28 DIAGNOSIS — M7989 Other specified soft tissue disorders: Secondary | ICD-10-CM | POA: Insufficient documentation

## 2018-01-28 DIAGNOSIS — M79604 Pain in right leg: Secondary | ICD-10-CM | POA: Diagnosis not present

## 2018-01-28 DIAGNOSIS — M79661 Pain in right lower leg: Secondary | ICD-10-CM

## 2018-01-28 DIAGNOSIS — R600 Localized edema: Secondary | ICD-10-CM | POA: Diagnosis not present

## 2018-04-11 DIAGNOSIS — H40013 Open angle with borderline findings, low risk, bilateral: Secondary | ICD-10-CM | POA: Diagnosis not present

## 2018-04-11 DIAGNOSIS — H2513 Age-related nuclear cataract, bilateral: Secondary | ICD-10-CM | POA: Diagnosis not present

## 2018-04-11 DIAGNOSIS — H524 Presbyopia: Secondary | ICD-10-CM | POA: Diagnosis not present

## 2018-04-11 DIAGNOSIS — H25013 Cortical age-related cataract, bilateral: Secondary | ICD-10-CM | POA: Diagnosis not present

## 2018-04-11 DIAGNOSIS — H25813 Combined forms of age-related cataract, bilateral: Secondary | ICD-10-CM | POA: Diagnosis not present

## 2018-08-22 DIAGNOSIS — H25813 Combined forms of age-related cataract, bilateral: Secondary | ICD-10-CM | POA: Diagnosis not present

## 2018-08-22 DIAGNOSIS — H40001 Preglaucoma, unspecified, right eye: Secondary | ICD-10-CM | POA: Diagnosis not present

## 2018-08-22 DIAGNOSIS — H43391 Other vitreous opacities, right eye: Secondary | ICD-10-CM | POA: Diagnosis not present

## 2018-08-22 DIAGNOSIS — H43811 Vitreous degeneration, right eye: Secondary | ICD-10-CM | POA: Diagnosis not present

## 2018-08-22 DIAGNOSIS — H2513 Age-related nuclear cataract, bilateral: Secondary | ICD-10-CM | POA: Diagnosis not present

## 2018-08-22 DIAGNOSIS — H40002 Preglaucoma, unspecified, left eye: Secondary | ICD-10-CM | POA: Diagnosis not present

## 2018-08-22 DIAGNOSIS — H25013 Cortical age-related cataract, bilateral: Secondary | ICD-10-CM | POA: Diagnosis not present

## 2018-10-19 DIAGNOSIS — F411 Generalized anxiety disorder: Secondary | ICD-10-CM | POA: Diagnosis not present

## 2018-10-19 DIAGNOSIS — R42 Dizziness and giddiness: Secondary | ICD-10-CM | POA: Diagnosis not present

## 2019-03-01 DIAGNOSIS — Z1331 Encounter for screening for depression: Secondary | ICD-10-CM | POA: Diagnosis not present

## 2019-03-01 DIAGNOSIS — F411 Generalized anxiety disorder: Secondary | ICD-10-CM | POA: Diagnosis not present

## 2019-03-01 DIAGNOSIS — Z1389 Encounter for screening for other disorder: Secondary | ICD-10-CM | POA: Diagnosis not present

## 2019-03-01 DIAGNOSIS — Z0001 Encounter for general adult medical examination with abnormal findings: Secondary | ICD-10-CM | POA: Diagnosis not present

## 2019-03-07 DIAGNOSIS — Z0001 Encounter for general adult medical examination with abnormal findings: Secondary | ICD-10-CM | POA: Diagnosis not present

## 2019-03-07 DIAGNOSIS — I10 Essential (primary) hypertension: Secondary | ICD-10-CM | POA: Diagnosis not present

## 2019-07-10 DIAGNOSIS — R0982 Postnasal drip: Secondary | ICD-10-CM | POA: Diagnosis not present

## 2019-07-10 DIAGNOSIS — J069 Acute upper respiratory infection, unspecified: Secondary | ICD-10-CM | POA: Diagnosis not present

## 2020-02-16 DIAGNOSIS — L720 Epidermal cyst: Secondary | ICD-10-CM | POA: Diagnosis not present

## 2020-04-19 DIAGNOSIS — L7211 Pilar cyst: Secondary | ICD-10-CM | POA: Diagnosis not present

## 2020-04-19 DIAGNOSIS — D485 Neoplasm of uncertain behavior of skin: Secondary | ICD-10-CM | POA: Diagnosis not present

## 2020-05-17 DIAGNOSIS — Z1389 Encounter for screening for other disorder: Secondary | ICD-10-CM | POA: Diagnosis not present

## 2020-05-17 DIAGNOSIS — Z1331 Encounter for screening for depression: Secondary | ICD-10-CM | POA: Diagnosis not present

## 2020-05-17 DIAGNOSIS — Z79899 Other long term (current) drug therapy: Secondary | ICD-10-CM | POA: Diagnosis not present

## 2020-05-17 DIAGNOSIS — E785 Hyperlipidemia, unspecified: Secondary | ICD-10-CM | POA: Diagnosis not present

## 2020-05-17 DIAGNOSIS — F411 Generalized anxiety disorder: Secondary | ICD-10-CM | POA: Diagnosis not present

## 2020-05-17 DIAGNOSIS — Z0001 Encounter for general adult medical examination with abnormal findings: Secondary | ICD-10-CM | POA: Diagnosis not present

## 2020-05-17 DIAGNOSIS — I10 Essential (primary) hypertension: Secondary | ICD-10-CM | POA: Diagnosis not present

## 2020-06-17 DIAGNOSIS — I10 Essential (primary) hypertension: Secondary | ICD-10-CM | POA: Diagnosis not present

## 2020-06-17 DIAGNOSIS — E785 Hyperlipidemia, unspecified: Secondary | ICD-10-CM | POA: Diagnosis not present

## 2020-07-18 DIAGNOSIS — I10 Essential (primary) hypertension: Secondary | ICD-10-CM | POA: Diagnosis not present

## 2020-07-18 DIAGNOSIS — E785 Hyperlipidemia, unspecified: Secondary | ICD-10-CM | POA: Diagnosis not present

## 2020-07-31 DIAGNOSIS — H40013 Open angle with borderline findings, low risk, bilateral: Secondary | ICD-10-CM | POA: Diagnosis not present

## 2020-08-15 DIAGNOSIS — I10 Essential (primary) hypertension: Secondary | ICD-10-CM | POA: Diagnosis not present

## 2020-08-15 DIAGNOSIS — E785 Hyperlipidemia, unspecified: Secondary | ICD-10-CM | POA: Diagnosis not present

## 2020-09-02 ENCOUNTER — Other Ambulatory Visit: Payer: Self-pay

## 2020-09-02 ENCOUNTER — Encounter: Payer: Self-pay | Admitting: Emergency Medicine

## 2020-09-02 ENCOUNTER — Ambulatory Visit: Admission: EM | Admit: 2020-09-02 | Discharge: 2020-09-02 | Payer: Medicare Other

## 2020-09-02 ENCOUNTER — Encounter (HOSPITAL_COMMUNITY): Payer: Self-pay | Admitting: *Deleted

## 2020-09-02 ENCOUNTER — Emergency Department (HOSPITAL_COMMUNITY)
Admission: EM | Admit: 2020-09-02 | Discharge: 2020-09-02 | Disposition: A | Discharge status: Home / Self Care | Payer: Medicare Other | Attending: Emergency Medicine | Admitting: Emergency Medicine

## 2020-09-02 ENCOUNTER — Emergency Department (HOSPITAL_COMMUNITY): Payer: Medicare Other

## 2020-09-02 DIAGNOSIS — M79604 Pain in right leg: Secondary | ICD-10-CM

## 2020-09-02 DIAGNOSIS — I10 Essential (primary) hypertension: Secondary | ICD-10-CM | POA: Insufficient documentation

## 2020-09-02 DIAGNOSIS — M7121 Synovial cyst of popliteal space [Baker], right knee: Secondary | ICD-10-CM | POA: Diagnosis not present

## 2020-09-02 DIAGNOSIS — M79661 Pain in right lower leg: Secondary | ICD-10-CM | POA: Diagnosis present

## 2020-09-02 HISTORY — DX: Essential (primary) hypertension: I10

## 2020-09-02 NOTE — ED Triage Notes (Signed)
Right leg pain, sent from urgent care for evaluation

## 2020-09-02 NOTE — Discharge Instructions (Addendum)
Go to the Emergency department for ultrasound of your leg

## 2020-09-02 NOTE — ED Notes (Signed)
Entered room and introduced self to patient. Pt appears to be resting in bed, respirations are even and unlabored with equal chest rise and fall. Bed is locked in the lowest position, side rails x2, call bell within reach. Pt educated on call light use and hourly rounding, verbalized understanding and in agreement at this time. All questions and concerns voiced addressed. Refreshments offered and provided per patient request.  

## 2020-09-02 NOTE — Discharge Instructions (Signed)
Your ultrasound showed that you have a Baker's cyst behind your right knee which is likely causing your pain. Please see attached information on the subject.   Please use ace bandage to help with compression of the area. While at home please rest, ice, and elevate your knee to help reduce pain/swelling. You can take Tylenol as needed for pain.   Follow up with either your PCP or Dr. Aline Brochure orthopedist for further evaluation  Return to the ED for any worsening symptoms

## 2020-09-02 NOTE — ED Triage Notes (Signed)
Right foot and leg pain up to groin area x 1 week.  No known injury.

## 2020-09-02 NOTE — ED Notes (Signed)
Patient transported to Ultrasound 

## 2020-09-02 NOTE — ED Provider Notes (Signed)
[facility]    CSN: [id number] Arrival date & time: 09/02/20  1132      History   Chief Complaint No chief complaint on file.   HPI Johnny Gates is a 75 y.o. male.   Pt complains of pain in his right calf.  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  His wife sent him to be seen to make sure he does not have a blood clot.   The history is provided by the patient. No language interpreter was used.  Leg Pain Location:  Leg Injury: no   Leg location:  R leg Pain details:    Quality:  Aching   Radiates to:  Does not radiate   Severity:  Moderate   Timing:  Constant   Progression:  Worsening Chronicity:  New Relieved by:  Nothing Ineffective treatments:  None tried Risk factors: no frequent fractures     Past Medical History:  Diagnosis Date  . Hypertension   . Seasonal allergies     There are no problems to display for this patient.   Past Surgical History:  Procedure Laterality Date  . COLONOSCOPY N/A 04/01/2016   Procedure: COLONOSCOPY;  Surgeon: Rogene Houston, MD;  [facility]: [facility];  Service: Endoscopy;  Laterality: N/A;  830  . Left shoulder surgery         Home Medications    Prior to Admission medications   Medication Sig Start Date End Date Taking? Authorizing Provider  acetaminophen (TYLENOL) 500 MG tablet Take 500-1,000 mg by mouth every 6 (six) hours as needed (for pain.).    [provider]  Ascorbic Acid (VITAMIN C) 1000 MG tablet Take 1,000 mg by mouth daily.    [provider]  cetirizine (ZYRTEC) 10 MG tablet Take 10 mg by mouth daily.    [provider]  Cholecalciferol (VITAMIN D3) 5000 units TABS Take 5,000 Units by mouth daily.    [provider]  clobetasol cream (TEMOVATE) 4.70 % Apply 1 application topically 2 (two) times daily as needed (for itch/rash).    [provider]  Lactobacillus (ACIDOPHILUS PO) Take 1 capsule by mouth daily. 10  billion    [provider]  Misc Natural Products (CALCIUM PLUS ADVANCED PO) Take 3 tablets by mouth daily.    [provider]  Misc Natural Products (PROSTATE HEALTH PO) Take 2 capsules by mouth daily. Prostate Optimizer    [provider]  OVER THE COUNTER MEDICATION Take 1 tablet by mouth daily. Trace Minerals Calcium, Silicon, Selenium, Phosphorus, Iodine, Chromium, Manganese, Iron, Copper, Molybdenum, Zinc, Vanadium.    [provider]  traMADol-acetaminophen (ULTRACET) 37.5-325 MG tablet Take 1 tablet by mouth every 6 (six) hours as needed (for pain.).    [provider]  vitamin A 25000 UNIT capsule Take 25,000 Units by mouth daily.    [provider]  zinc gluconate 50 MG tablet Take 50 mg by mouth daily.    [provider]    Family History Family History  Problem Relation Age of Onset  . Colon cancer Neg Hx     Social History Social History   Tobacco Use  . Smoking status: Never Smoker  . Smokeless tobacco: Never Used  Substance Use Topics  . Alcohol use: No  . Drug use: No     Allergies   Patient has no known allergies.   Review of Systems Review of Systems  All other systems  reviewed and are negative.    Physical Exam Triage Vital Signs ED Triage Vitals  Enc Vitals Group     BP 09/02/20 1214 129/84     Pulse Rate 09/02/20 1214 83     Resp 09/02/20 1214 18     Temp 09/02/20 1214 98 F (36.7 C)     Temp Source 09/02/20 1214 Oral     SpO2 09/02/20 1214 97 %     Weight --      Height --      Head Circumference --      Peak Flow --      Pain Score 09/02/20 1215 6     Pain Loc --      Pain Edu? --      Excl. in [place]? --    No data found.  Updated Vital Signs BP 129/84 (BP Location: Right Arm)   Pulse 83   Temp 98 F (36.7 C) (Oral)   Resp 18   SpO2 97%   Visual Acuity Right Eye Distance:   Left Eye Distance:   Bilateral Distance:    Right Eye Near:   Left Eye Near:    Bilateral  Near:     Physical Exam Vitals and nursing note reviewed.  Constitutional:      Appearance: He is well-developed.  HENT:     Head: Normocephalic and atraumatic.  Cardiovascular:     Rate and Rhythm: Normal rate.  Pulmonary:     Effort: Pulmonary effort is normal.  Musculoskeletal:        General: Swelling present.     Comments: Tender right calf,  Right knee is larger than left,    Skin:    General: Skin is warm.  Neurological:     Mental Status: He is alert.  Psychiatric:        Mood and Affect: Mood normal.      UC Treatments / Results  Labs (all labs ordered are listed, but only abnormal results are displayed) Labs Reviewed - No data to display  EKG   Radiology No results found.  Procedures Procedures (including critical care time)  Medications Ordered in UC Medications - No data to display  Initial Impression / Assessment and Plan / UC Course  I have reviewed the triage vital signs and the nursing notes.  Pertinent labs & imaging results that were available during my care of the patient were reviewed by me and considered in my medical decision making (see chart for details).     MDM:  I suspect arthritis from right knee is cause of discomfort however due to driving and calf tightness.  I think pt should got to ED for ultrasound  Final Clinical Impressions(s) / UC Diagnoses   Final diagnoses:  Right leg pain     Discharge Instructions     Go to the Emergency department for ultrasound of your leg   ED Prescriptions    None     PDMP not reviewed this encounter.  An After Visit Summary was printed and given to the patient.    Fransico Meadow, [place] 09/02/20 1306

## 2020-09-02 NOTE — ED Provider Notes (Signed)
[facility] EMERGENCY DEPARTMENT Provider Note   CSN: [id number] Arrival date & time: 09/02/20  1307     History Chief Complaint  Patient presents with   Leg Pain    Johnny Gates is a 75 y.o. male with PMHx HTN who presents to the ED today from UC with complaint of right calf pain x 1-2 months. Pt denies any trauma to the area. He does mention that prior to the pain beginning he was changing a lightbulb for his wife and is unsure if he may have pulled a muscle in the process. 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 He went to UC today as he felt like the swelling in his leg was getting worse, mostly about the knee, and was concerned for a blood clot. Pt was sent to the ED for ultrasound. He denies any chest pain, shortness of breath, or any other associated symptoms.     The history is provided by the patient and medical records.       Past Medical History:  Diagnosis Date   Hypertension    Seasonal allergies     There are no problems to display for this patient.   Past Surgical History:  Procedure Laterality Date   COLONOSCOPY N/A 04/01/2016   Procedure: COLONOSCOPY;  Surgeon: Rogene Houston, MD;  [facility]: [facility];  Service: Endoscopy;  Laterality: N/A;  830   Left shoulder surgery         Family History  Problem Relation Age of Onset   Colon cancer Neg Hx     Social History   Tobacco Use   Smoking status: Never Smoker   Smokeless tobacco: Never Used  Substance Use Topics   Alcohol use: No   Drug use: No    Home Medications Prior to Admission medications   Medication Sig Start Date End Date Taking? Authorizing Provider  acetaminophen (TYLENOL) 500 MG tablet Take 500-1,000 mg by mouth every 6 (six) hours as needed (for pain.).    [provider]  Ascorbic Acid (VITAMIN C) 1000 MG tablet Take 1,000 mg by mouth daily.    [provider]  cetirizine (ZYRTEC) 10 MG tablet Take 10 mg by mouth daily.     [provider]  Cholecalciferol (VITAMIN D3) 5000 units TABS Take 5,000 Units by mouth daily.    [provider]  clobetasol cream (TEMOVATE) 6.78 % Apply 1 application topically 2 (two) times daily as needed (for itch/rash).    [provider]  Lactobacillus (ACIDOPHILUS PO) Take 1 capsule by mouth daily. 10 billion    [provider]  Misc Natural Products (CALCIUM PLUS ADVANCED PO) Take 3 tablets by mouth daily.    [provider]  Misc Natural Products (PROSTATE HEALTH PO) Take 2 capsules by mouth daily. Prostate Optimizer    [provider]  OVER THE COUNTER MEDICATION Take 1 tablet by mouth daily. Trace Minerals Calcium, Silicon, Selenium, Phosphorus, Iodine, Chromium, Manganese, Iron, Copper, Molybdenum, Zinc, Vanadium.    [provider]  traMADol-acetaminophen (ULTRACET) 37.5-325 MG tablet Take 1 tablet by mouth every 6 (six) hours as needed (for pain.).    [provider]  vitamin A 25000 UNIT capsule Take 25,000 Units by mouth daily.    [provider]  zinc gluconate 50 MG tablet Take 50 mg by mouth daily.    [provider]    Allergies    Patient has no known allergies.  Review of Systems  Review of Systems  Constitutional: Negative for chills and fever.  Respiratory: Negative for shortness of breath.   Cardiovascular: Negative for chest pain.  Musculoskeletal: Positive for arthralgias and joint swelling.  All other systems reviewed and are negative.   Physical Exam Updated Vital Signs BP 122/79    Pulse 71    Temp 98 F (36.7 C) (Oral)    Resp 18    SpO2 100%   Physical Exam Vitals and nursing note reviewed.  Constitutional:      Appearance: He is obese. He is not ill-appearing or diaphoretic.  HENT:     Head: Normocephalic and atraumatic.  Eyes:     Conjunctiva/sclera: Conjunctivae normal.  Cardiovascular:     Rate and Rhythm: Normal rate and regular rhythm.     Pulses:  Normal pulses.  Pulmonary:     Effort: Pulmonary effort is normal.     Breath sounds: Normal breath sounds. No wheezing, rhonchi or rales.  Abdominal:     Palpations: Abdomen is soft.     Tenderness: There is no abdominal tenderness.  Musculoskeletal:     Cervical back: Neck supple.     Comments: Moderate swelling mostly to the R knee joint as well as mild 1+ pitting edema to the RLE. No tenderness to the popliteal area. Mild TTP to the lateral right calf. ROM intact to knee. Strength and sensation intact to RLE. 2+ DP pulse.   Skin:    General: Skin is warm and dry.  Neurological:     Mental Status: He is alert.     ED Results / Procedures / Treatments   Labs (all labs ordered are listed, but only abnormal results are displayed) Labs Reviewed - No data to display  EKG None  Radiology US Venous Img Lower Right (DVT Study)  Result Date: 09/02/2020 CLINICAL DATA:  Right lower extremity pain. EXAM: Right LOWER EXTREMITY VENOUS DOPPLER ULTRASOUND TECHNIQUE: Gray-scale sonography with compression, as well as color and duplex ultrasound, were performed to evaluate the deep venous system(s) from the level of the common femoral vein through the popliteal and proximal calf veins. COMPARISON:  None FINDINGS: VENOUS Normal compressibility of the common femoral, superficial femoral, and popliteal veins, as well as the visualized calf veins. Visualized portions of profunda femoral vein and great saphenous vein unremarkable. No filling defects to suggest DVT on grayscale or color Doppler imaging. Doppler waveforms show normal direction of venous flow, normal respiratory plasticity and response to augmentation. Limited views of the contralateral common femoral vein are unremarkable. OTHER There is a 5.3 x 1.7 x 4.1 cm Baker's cyst noted. Limitations: none IMPRESSION: Negative venous Doppler examination for deep venous thrombosis in the right lower extremity. 5 cm Baker's cyst. Electronically Signed    By: Marijo Sanes M.D.   On: 09/02/2020 14:57    Procedures Procedures   Medications Ordered in ED Medications - No data to display  ED Course  I have reviewed the triage vital signs and the nursing notes.  Pertinent labs & imaging results that were available during my care of the patient were reviewed by me and considered in my medical decision making (see chart for details).    MDM Rules/Calculators/A&P                          75 year old male who presents to the ED from urgent care for DVT rule out.  Presented with complaint of right knee/right lower extremity pain for the  past 1.5 months.  Worsening swelling recently prompting patient to go to urgent care today with concern for possible DVT.  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  His vitals are stable on arrival.  He denies any chest pain or shortness of breath.  We will plan for DVT study at this time and lab work in case patient needs to be started on a DOAC.   CT study has returned negative for blood clot however patient does have a 5 cm Baker cyst on the right side which I suspect is causing pain.  He has not had labs drawn at this time however given he is negative for DVT I do not feel he needs labs at this time.  We will treat symptomatically for Baker's cyst with outpatient follow-up with orthopedist.  Patient is in agreement with plan at this time and stable for discharge.   This note was prepared using Dragon voice recognition software and may include unintentional dictation errors due to the inherent limitations of voice recognition software.  Final Clinical Impression(s) / ED Diagnoses Final diagnoses:  Baker's cyst of knee, right    Rx / DC Orders ED Discharge Orders    None       Discharge Instructions     Your ultrasound showed that you have a Baker's cyst behind your right knee which is likely causing your pain. Please see attached information on the subject.    Please use ace bandage to help with compression of the area. While at home please rest, ice, and elevate your knee to help reduce pain/swelling. You can take Tylenol as needed for pain.   Follow up with either your PCP or Dr. Aline Brochure orthopedist for further evaluation  Return to the ED for any worsening symptoms       Eustaquio Maize, PA-C 09/02/20 1518    Sherwood Gambler, MD 09/03/20 [phone]

## 2020-09-02 NOTE — ED Notes (Signed)
ED Provider at bedside. 

## 2020-09-06 DIAGNOSIS — L244 Irritant contact dermatitis due to drugs in contact with skin: Secondary | ICD-10-CM | POA: Diagnosis not present

## 2020-09-06 DIAGNOSIS — L570 Actinic keratosis: Secondary | ICD-10-CM | POA: Diagnosis not present

## 2020-09-16 DIAGNOSIS — E785 Hyperlipidemia, unspecified: Secondary | ICD-10-CM | POA: Diagnosis not present

## 2020-09-16 DIAGNOSIS — I10 Essential (primary) hypertension: Secondary | ICD-10-CM | POA: Diagnosis not present

## 2020-09-26 ENCOUNTER — Ambulatory Visit (HOSPITAL_COMMUNITY)
Admission: RE | Admit: 2020-09-26 | Discharge: 2020-09-26 | Disposition: A | Discharge status: Home / Self Care | Payer: Medicare Other | Source: Ambulatory Visit | Attending: Gerontology | Admitting: Gerontology

## 2020-09-26 ENCOUNTER — Other Ambulatory Visit (HOSPITAL_COMMUNITY): Payer: Self-pay | Admitting: Gerontology

## 2020-09-26 DIAGNOSIS — M25561 Pain in right knee: Secondary | ICD-10-CM | POA: Insufficient documentation

## 2020-09-26 DIAGNOSIS — M25461 Effusion, right knee: Secondary | ICD-10-CM | POA: Diagnosis not present

## 2020-09-26 DIAGNOSIS — M7121 Synovial cyst of popliteal space [Baker], right knee: Secondary | ICD-10-CM | POA: Diagnosis not present

## 2020-10-26 DIAGNOSIS — E785 Hyperlipidemia, unspecified: Secondary | ICD-10-CM | POA: Diagnosis not present

## 2020-10-26 DIAGNOSIS — I10 Essential (primary) hypertension: Secondary | ICD-10-CM | POA: Diagnosis not present

## 2020-11-06 DIAGNOSIS — M1711 Unilateral primary osteoarthritis, right knee: Secondary | ICD-10-CM | POA: Diagnosis not present

## 2020-11-08 DIAGNOSIS — L308 Other specified dermatitis: Secondary | ICD-10-CM | POA: Diagnosis not present

## 2020-11-08 DIAGNOSIS — L821 Other seborrheic keratosis: Secondary | ICD-10-CM | POA: Diagnosis not present

## 2020-11-08 DIAGNOSIS — L570 Actinic keratosis: Secondary | ICD-10-CM | POA: Diagnosis not present

## 2020-12-12 DIAGNOSIS — E785 Hyperlipidemia, unspecified: Secondary | ICD-10-CM | POA: Diagnosis not present

## 2020-12-12 DIAGNOSIS — I10 Essential (primary) hypertension: Secondary | ICD-10-CM | POA: Diagnosis not present

## 2021-01-11 DIAGNOSIS — E785 Hyperlipidemia, unspecified: Secondary | ICD-10-CM | POA: Diagnosis not present

## 2021-01-11 DIAGNOSIS — I10 Essential (primary) hypertension: Secondary | ICD-10-CM | POA: Diagnosis not present

## 2021-01-29 DIAGNOSIS — H401131 Primary open-angle glaucoma, bilateral, mild stage: Secondary | ICD-10-CM | POA: Diagnosis not present

## 2021-02-11 DIAGNOSIS — E785 Hyperlipidemia, unspecified: Secondary | ICD-10-CM | POA: Diagnosis not present

## 2021-02-11 DIAGNOSIS — I10 Essential (primary) hypertension: Secondary | ICD-10-CM | POA: Diagnosis not present

## 2021-03-06 DIAGNOSIS — H40052 Ocular hypertension, left eye: Secondary | ICD-10-CM | POA: Diagnosis not present

## 2021-03-06 DIAGNOSIS — H401111 Primary open-angle glaucoma, right eye, mild stage: Secondary | ICD-10-CM | POA: Diagnosis not present

## 2021-03-06 DIAGNOSIS — H401121 Primary open-angle glaucoma, left eye, mild stage: Secondary | ICD-10-CM | POA: Diagnosis not present

## 2021-03-06 DIAGNOSIS — H40051 Ocular hypertension, right eye: Secondary | ICD-10-CM | POA: Diagnosis not present

## 2021-03-14 DIAGNOSIS — I10 Essential (primary) hypertension: Secondary | ICD-10-CM | POA: Diagnosis not present

## 2021-03-14 DIAGNOSIS — E785 Hyperlipidemia, unspecified: Secondary | ICD-10-CM | POA: Diagnosis not present

## 2021-04-13 DIAGNOSIS — E785 Hyperlipidemia, unspecified: Secondary | ICD-10-CM | POA: Diagnosis not present

## 2021-04-13 DIAGNOSIS — I10 Essential (primary) hypertension: Secondary | ICD-10-CM | POA: Diagnosis not present

## 2021-04-17 DIAGNOSIS — H401131 Primary open-angle glaucoma, bilateral, mild stage: Secondary | ICD-10-CM | POA: Diagnosis not present

## 2021-05-14 DIAGNOSIS — E785 Hyperlipidemia, unspecified: Secondary | ICD-10-CM | POA: Diagnosis not present

## 2021-05-14 DIAGNOSIS — I10 Essential (primary) hypertension: Secondary | ICD-10-CM | POA: Diagnosis not present

## 2021-05-22 DIAGNOSIS — M13861 Other specified arthritis, right knee: Secondary | ICD-10-CM | POA: Diagnosis not present

## 2021-05-22 DIAGNOSIS — E785 Hyperlipidemia, unspecified: Secondary | ICD-10-CM | POA: Diagnosis not present

## 2021-05-22 DIAGNOSIS — Z1389 Encounter for screening for other disorder: Secondary | ICD-10-CM | POA: Diagnosis not present

## 2021-05-22 DIAGNOSIS — I10 Essential (primary) hypertension: Secondary | ICD-10-CM | POA: Diagnosis not present

## 2021-05-22 DIAGNOSIS — Z1331 Encounter for screening for depression: Secondary | ICD-10-CM | POA: Diagnosis not present

## 2021-06-22 DIAGNOSIS — I10 Essential (primary) hypertension: Secondary | ICD-10-CM | POA: Diagnosis not present

## 2021-06-22 DIAGNOSIS — M13861 Other specified arthritis, right knee: Secondary | ICD-10-CM | POA: Diagnosis not present

## 2021-07-23 DIAGNOSIS — I10 Essential (primary) hypertension: Secondary | ICD-10-CM | POA: Diagnosis not present

## 2021-07-23 DIAGNOSIS — M13861 Other specified arthritis, right knee: Secondary | ICD-10-CM | POA: Diagnosis not present

## 2021-08-01 IMAGING — US US EXTREM LOW VENOUS*R*
1 series · 14 of 24 positions shown · non-contrast
Comparison: None

CLINICAL DATA: Right lower extremity pain.

EXAM:
Right LOWER EXTREMITY VENOUS DOPPLER ULTRASOUND
TECHNIQUE: Gray-scale sonography with compression, as well as color and duplex
ultrasound, were performed to evaluate the deep venous system(s)
from the level of the common femoral vein through the popliteal and
proximal calf veins.

[Series 1: us extrem low venous*right* · 0.10mm/px · 14 of 42 slices shown]
[im 1/42]
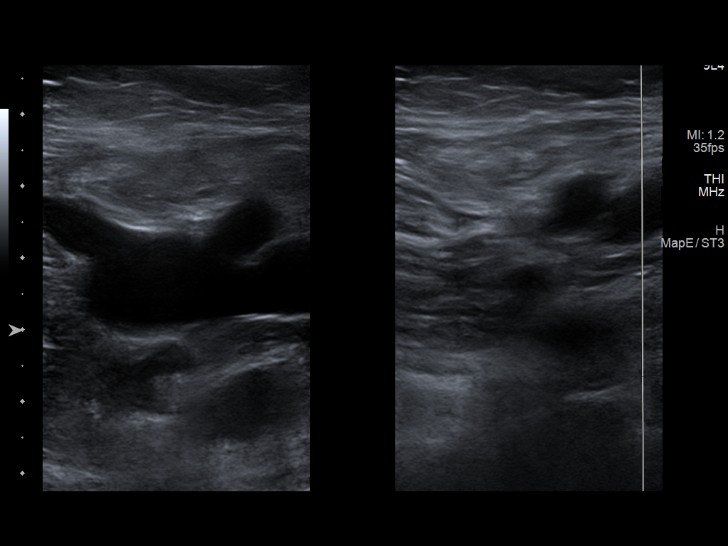
[im 4/42]
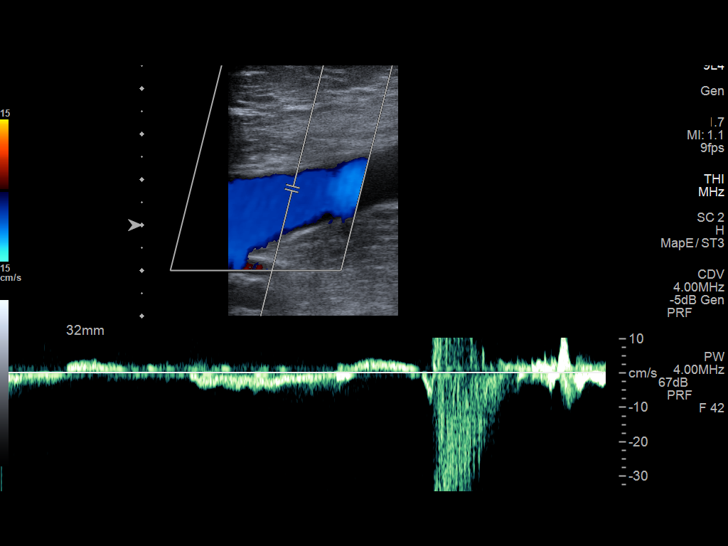
[im 8/42]
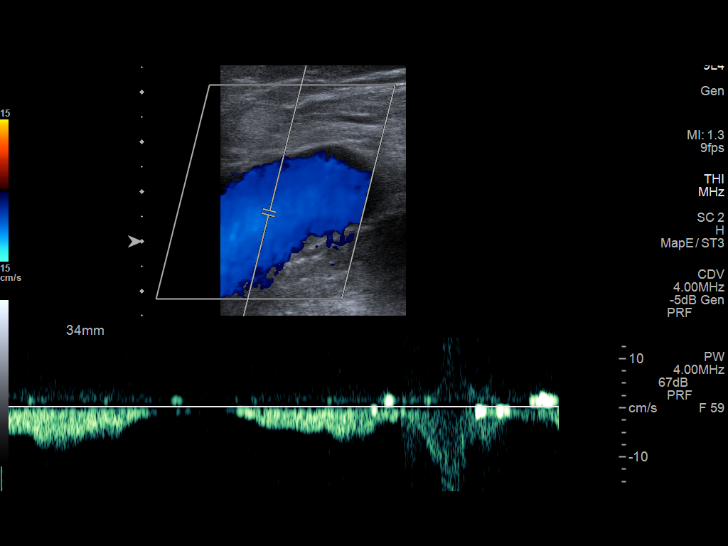
[im 11/42]
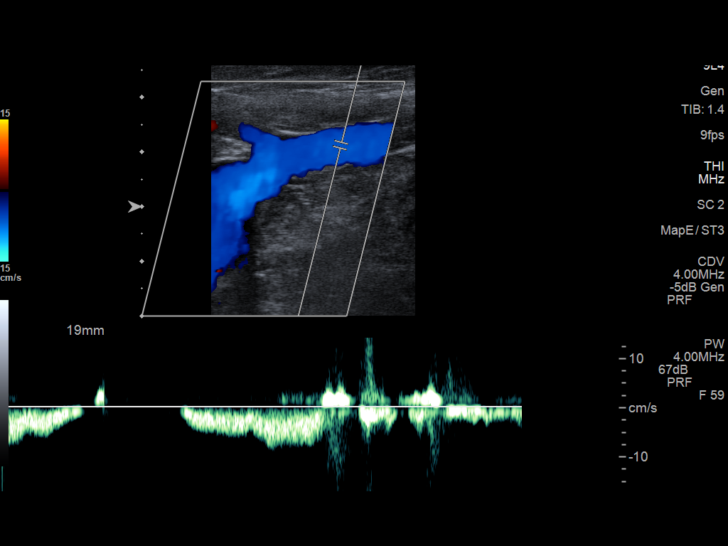
[im 13/42]
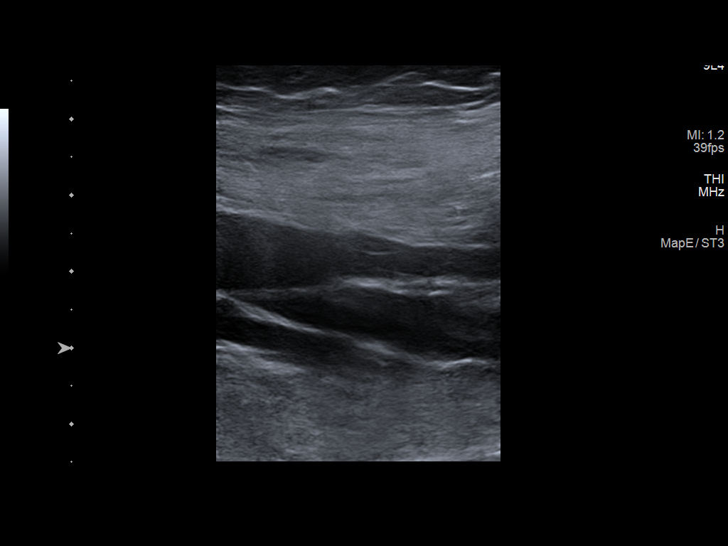
[im 17/42]
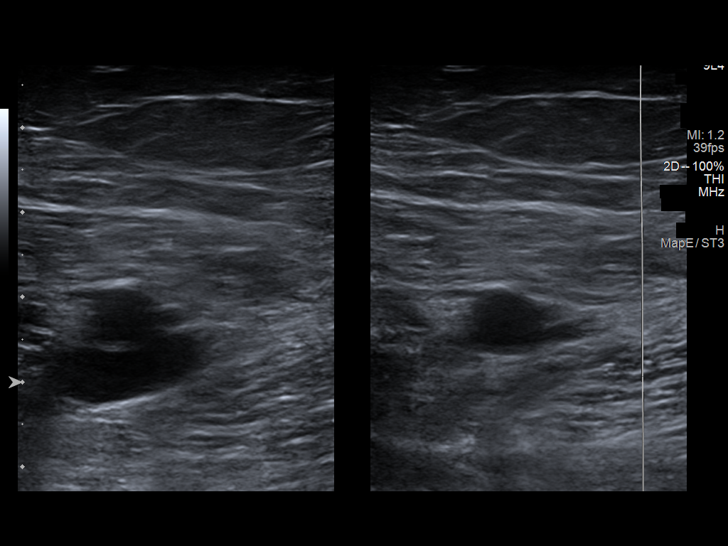
[im 20/42]
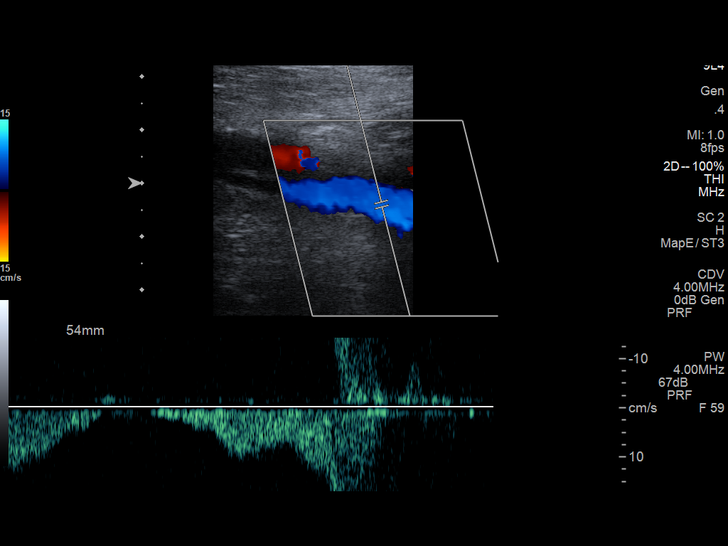
[im 22/42]
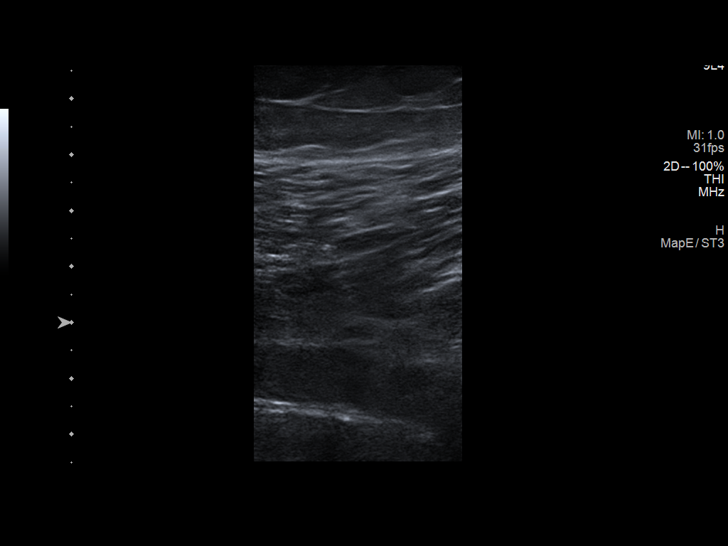
[im 25/42]
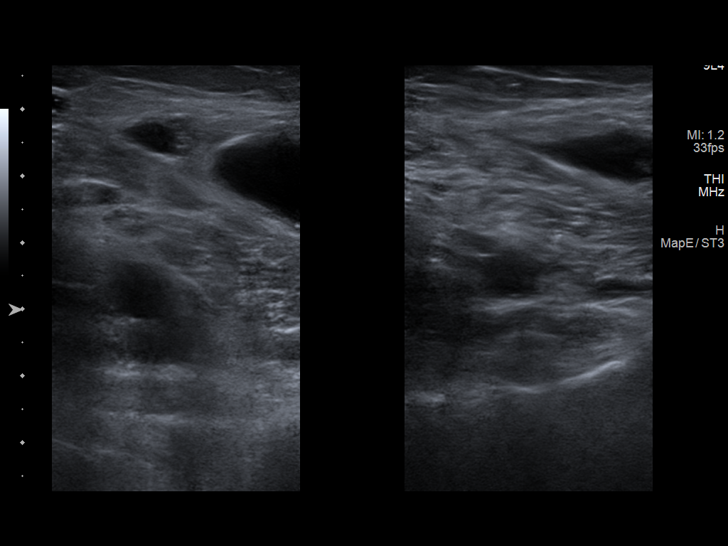
[im 29/42]
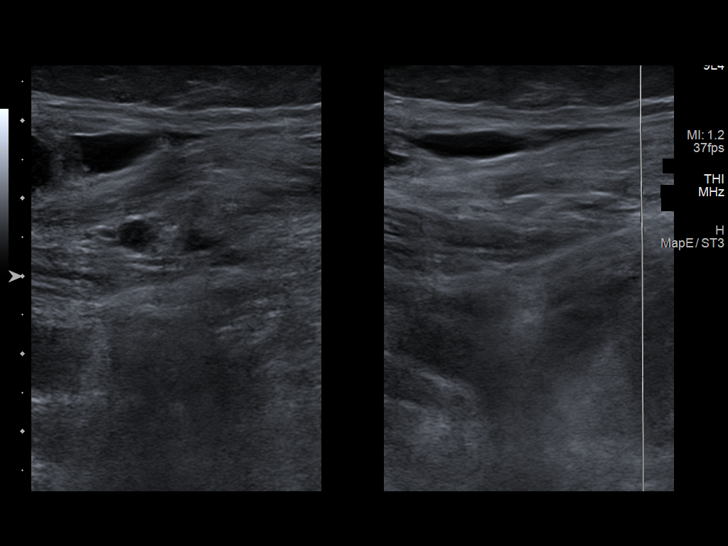
[im 33/42]
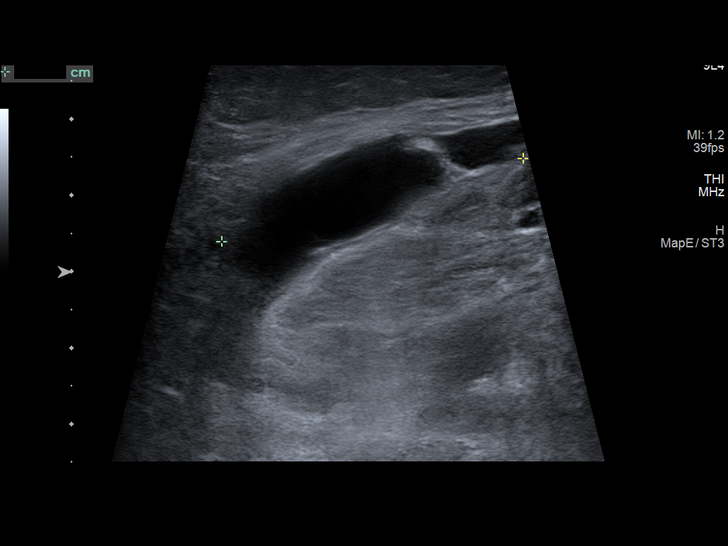
[im 34/42]
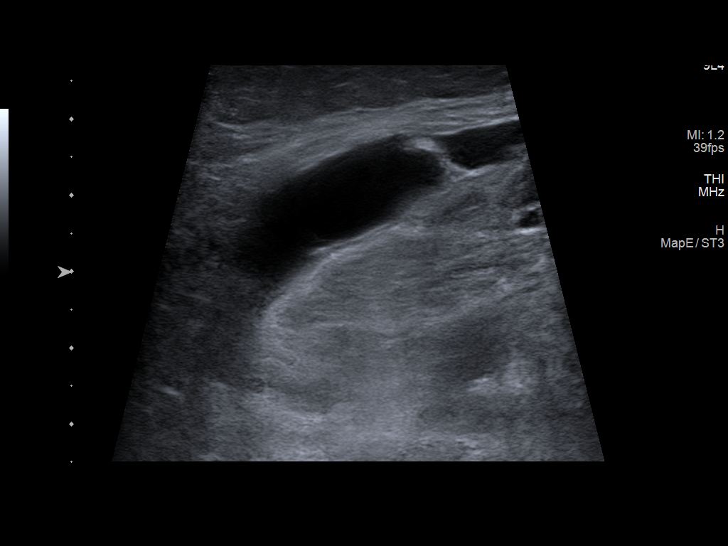
[im 38/42]
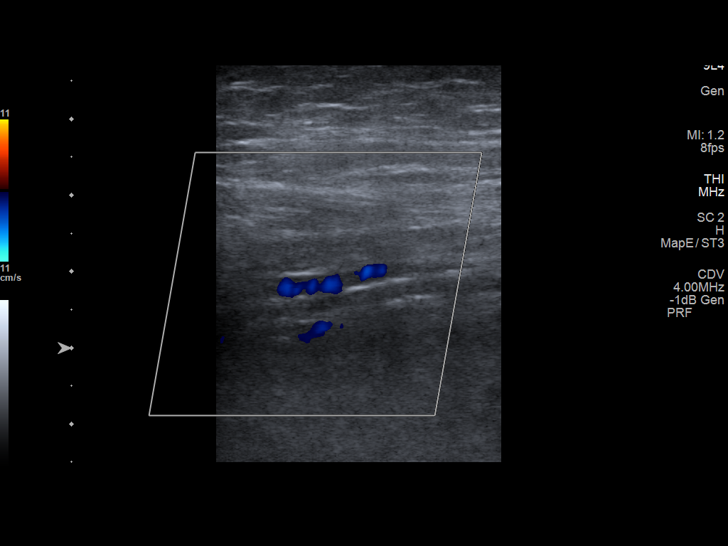
[im 42/42]
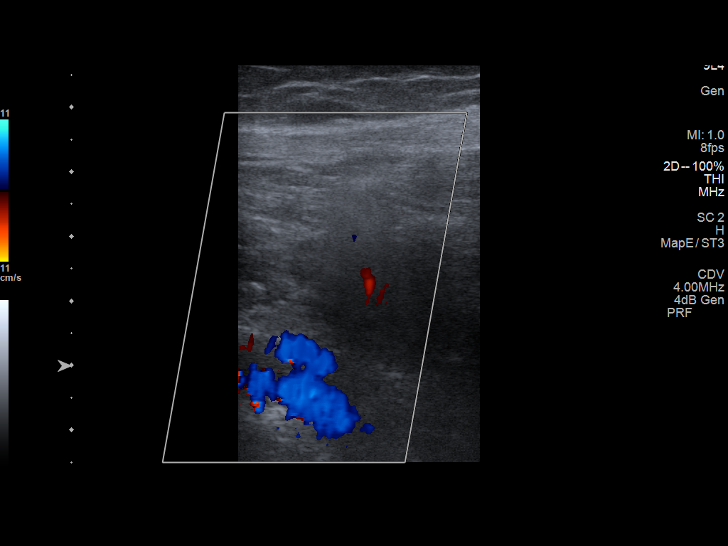

[14 of 24 positions shown; findings below may reference images not displayed]

FINDINGS: VENOUS

Normal compressibility of the common femoral, superficial femoral,
and popliteal veins, as well as the visualized calf veins.
Visualized portions of profunda femoral vein and great saphenous
vein unremarkable. No filling defects to suggest DVT on grayscale or
color Doppler imaging. Doppler waveforms show normal direction of
venous flow, normal respiratory plasticity and response to
augmentation.

Limited views of the contralateral common femoral vein are
unremarkable.

OTHER

There is a 5.3 x 1.7 x 4.1 cm Baker's cyst noted.

Limitations: none
IMPRESSION: Negative venous Doppler examination for deep venous thrombosis in
the right lower extremity.

5 cm Baker's cyst.

## 2021-08-05 DIAGNOSIS — H401131 Primary open-angle glaucoma, bilateral, mild stage: Secondary | ICD-10-CM | POA: Diagnosis not present

## 2021-08-20 DIAGNOSIS — M13861 Other specified arthritis, right knee: Secondary | ICD-10-CM | POA: Diagnosis not present

## 2021-08-20 DIAGNOSIS — I10 Essential (primary) hypertension: Secondary | ICD-10-CM | POA: Diagnosis not present

## 2021-08-25 IMAGING — DX DG KNEE COMPLETE 4+V*R*
3 series · 3 of 3 positions shown · non-contrast
Comparison: None.

CLINICAL DATA: Right knee pain

EXAM:
RIGHT KNEE - COMPLETE 4+ VIEW

[knee obl (1 of 2)]
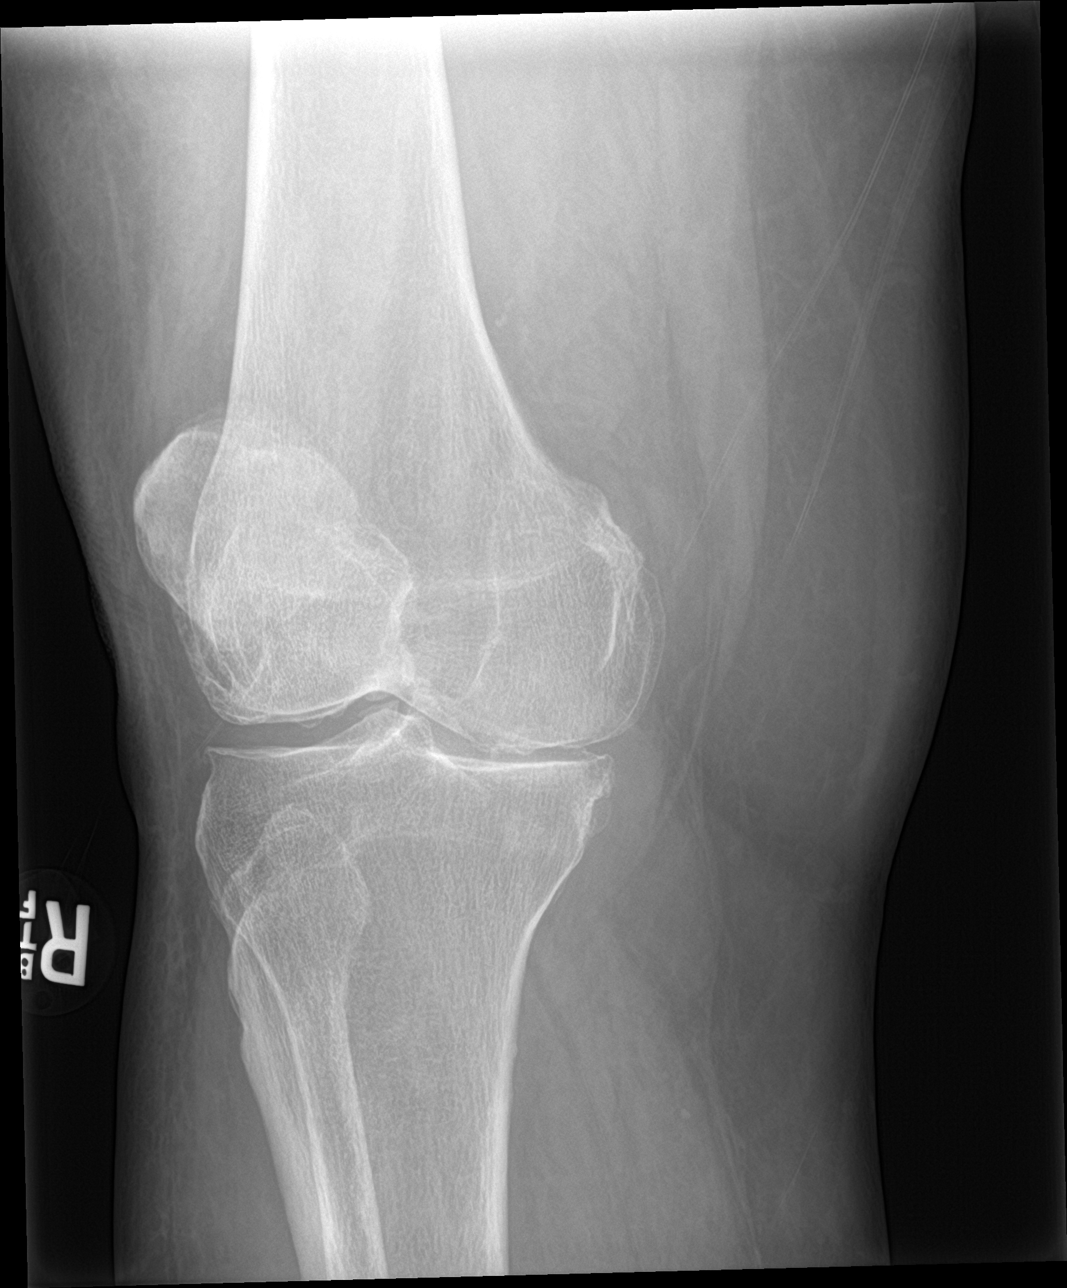

[knee obl (2 of 2)]
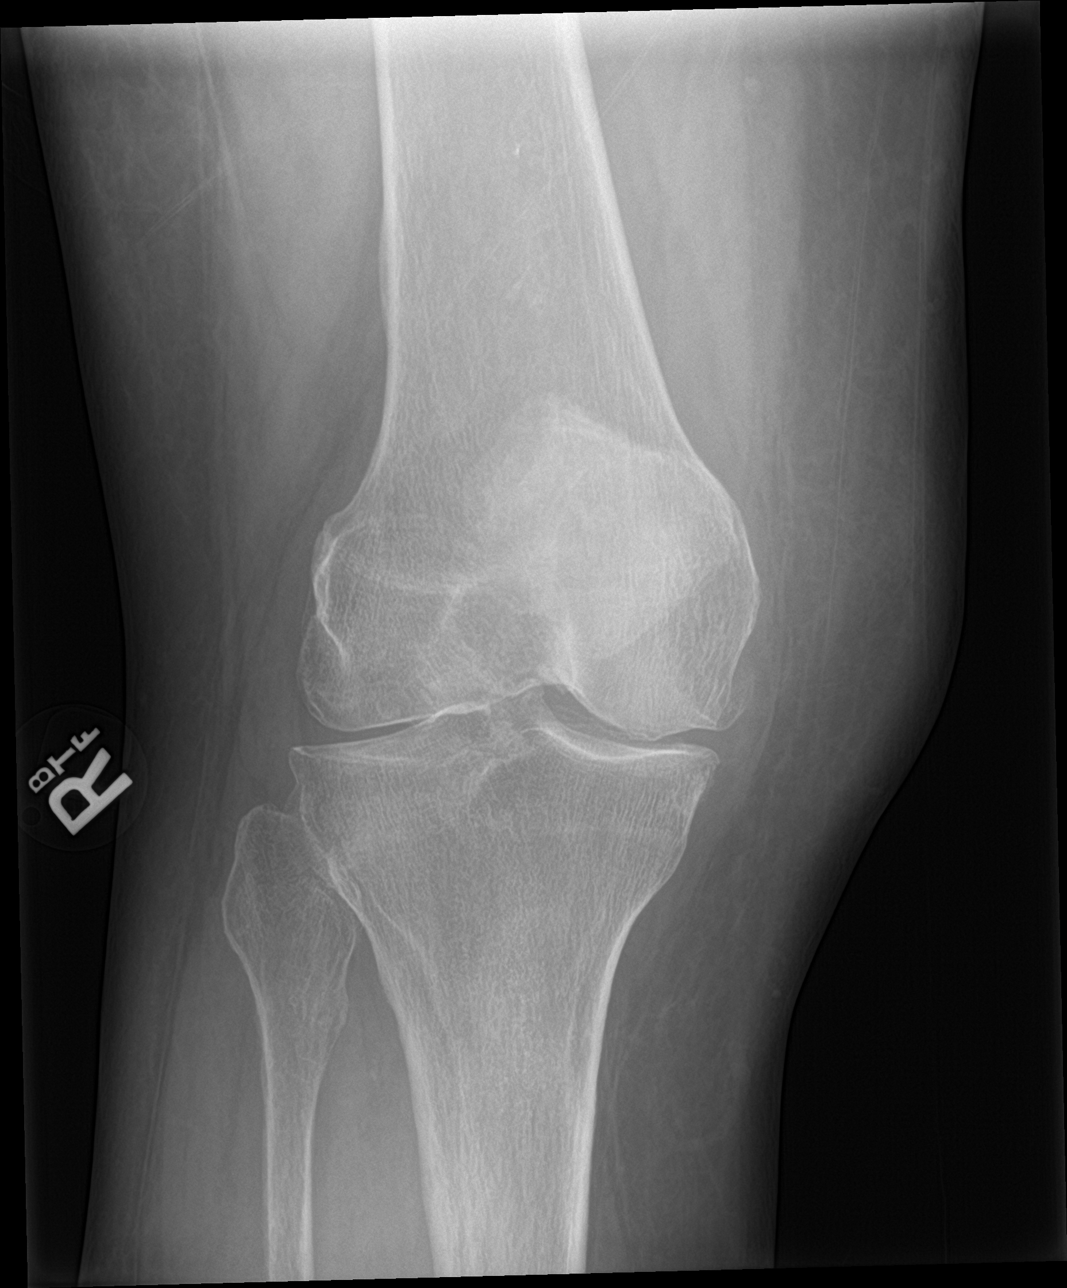

[knee lat]
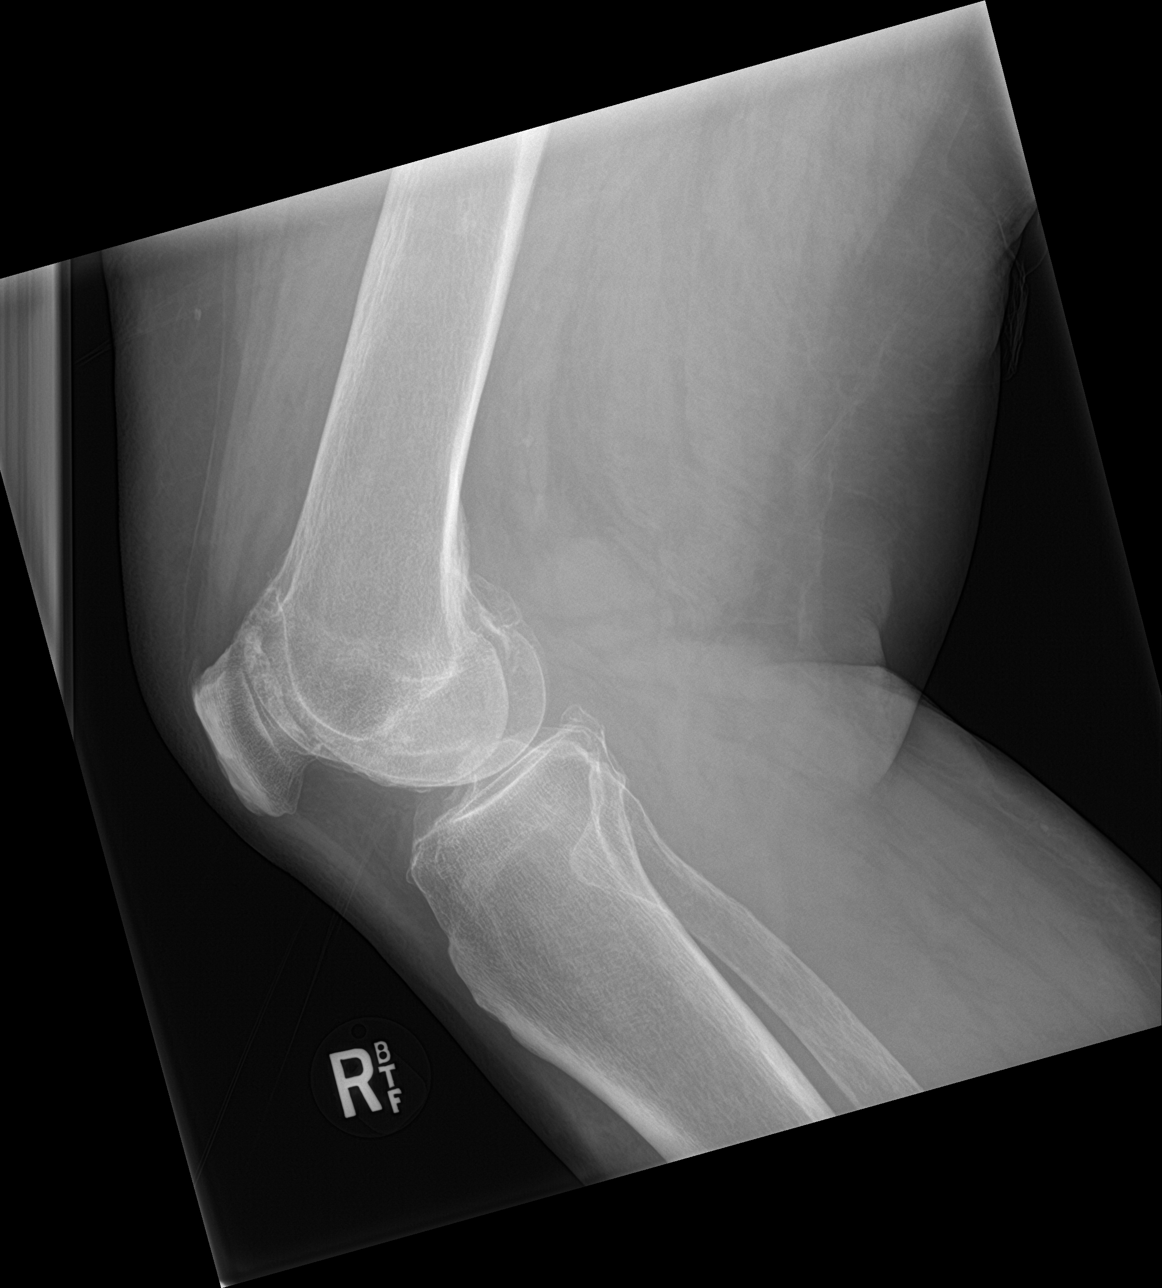

[3 of 3 positions shown; findings below may reference images not displayed]

FINDINGS: Four view radiograph right knee demonstrates moderate
tricompartmental degenerative arthritis with asymmetric joint space
narrowing, most severe within the medial compartment. Trace right
knee effusion. No acute fracture or dislocation. Normal overall
alignment. Soft tissues are unremarkable.
IMPRESSION: Moderate tricompartmental degenerative arthritis.

## 2021-09-20 DIAGNOSIS — I10 Essential (primary) hypertension: Secondary | ICD-10-CM | POA: Diagnosis not present

## 2021-09-20 DIAGNOSIS — M13861 Other specified arthritis, right knee: Secondary | ICD-10-CM | POA: Diagnosis not present

## 2021-10-20 DIAGNOSIS — I10 Essential (primary) hypertension: Secondary | ICD-10-CM | POA: Diagnosis not present

## 2021-10-20 DIAGNOSIS — M13861 Other specified arthritis, right knee: Secondary | ICD-10-CM | POA: Diagnosis not present

## 2021-11-18 DIAGNOSIS — I10 Essential (primary) hypertension: Secondary | ICD-10-CM | POA: Diagnosis not present

## 2021-11-18 DIAGNOSIS — M13861 Other specified arthritis, right knee: Secondary | ICD-10-CM | POA: Diagnosis not present

## 2021-11-18 DIAGNOSIS — F411 Generalized anxiety disorder: Secondary | ICD-10-CM | POA: Diagnosis not present

## 2021-11-21 DIAGNOSIS — L570 Actinic keratosis: Secondary | ICD-10-CM | POA: Diagnosis not present

## 2021-11-21 DIAGNOSIS — L821 Other seborrheic keratosis: Secondary | ICD-10-CM | POA: Diagnosis not present

## 2021-12-18 DIAGNOSIS — I10 Essential (primary) hypertension: Secondary | ICD-10-CM | POA: Diagnosis not present

## 2022-01-18 DIAGNOSIS — M13861 Other specified arthritis, right knee: Secondary | ICD-10-CM | POA: Diagnosis not present

## 2022-01-18 DIAGNOSIS — I10 Essential (primary) hypertension: Secondary | ICD-10-CM | POA: Diagnosis not present

## 2022-02-18 DIAGNOSIS — I10 Essential (primary) hypertension: Secondary | ICD-10-CM | POA: Diagnosis not present

## 2022-02-18 DIAGNOSIS — E785 Hyperlipidemia, unspecified: Secondary | ICD-10-CM | POA: Diagnosis not present

## 2022-02-25 DIAGNOSIS — H401131 Primary open-angle glaucoma, bilateral, mild stage: Secondary | ICD-10-CM | POA: Diagnosis not present

## 2022-03-20 DIAGNOSIS — I10 Essential (primary) hypertension: Secondary | ICD-10-CM | POA: Diagnosis not present

## 2022-03-20 DIAGNOSIS — E785 Hyperlipidemia, unspecified: Secondary | ICD-10-CM | POA: Diagnosis not present

## 2022-04-20 DIAGNOSIS — I10 Essential (primary) hypertension: Secondary | ICD-10-CM | POA: Diagnosis not present

## 2022-04-20 DIAGNOSIS — E785 Hyperlipidemia, unspecified: Secondary | ICD-10-CM | POA: Diagnosis not present

## 2022-05-20 DIAGNOSIS — I10 Essential (primary) hypertension: Secondary | ICD-10-CM | POA: Diagnosis not present

## 2022-05-20 DIAGNOSIS — F411 Generalized anxiety disorder: Secondary | ICD-10-CM | POA: Diagnosis not present

## 2022-05-20 DIAGNOSIS — E785 Hyperlipidemia, unspecified: Secondary | ICD-10-CM | POA: Diagnosis not present

## 2022-05-20 DIAGNOSIS — H9311 Tinnitus, right ear: Secondary | ICD-10-CM | POA: Diagnosis not present

## 2022-05-20 DIAGNOSIS — Z1389 Encounter for screening for other disorder: Secondary | ICD-10-CM | POA: Diagnosis not present

## 2022-05-20 DIAGNOSIS — Z1331 Encounter for screening for depression: Secondary | ICD-10-CM | POA: Diagnosis not present

## 2022-05-22 DIAGNOSIS — E785 Hyperlipidemia, unspecified: Secondary | ICD-10-CM | POA: Diagnosis not present

## 2022-05-22 DIAGNOSIS — I10 Essential (primary) hypertension: Secondary | ICD-10-CM | POA: Diagnosis not present

## 2022-06-20 DIAGNOSIS — I10 Essential (primary) hypertension: Secondary | ICD-10-CM | POA: Diagnosis not present

## 2022-06-20 DIAGNOSIS — E785 Hyperlipidemia, unspecified: Secondary | ICD-10-CM | POA: Diagnosis not present

## 2022-07-21 DIAGNOSIS — I10 Essential (primary) hypertension: Secondary | ICD-10-CM | POA: Diagnosis not present

## 2022-07-21 DIAGNOSIS — E785 Hyperlipidemia, unspecified: Secondary | ICD-10-CM | POA: Diagnosis not present

## 2022-08-19 DIAGNOSIS — I10 Essential (primary) hypertension: Secondary | ICD-10-CM | POA: Diagnosis not present

## 2022-08-19 DIAGNOSIS — E785 Hyperlipidemia, unspecified: Secondary | ICD-10-CM | POA: Diagnosis not present

## 2022-08-20 DIAGNOSIS — H401131 Primary open-angle glaucoma, bilateral, mild stage: Secondary | ICD-10-CM | POA: Diagnosis not present

## 2022-09-19 DIAGNOSIS — I10 Essential (primary) hypertension: Secondary | ICD-10-CM | POA: Diagnosis not present

## 2022-09-19 DIAGNOSIS — E785 Hyperlipidemia, unspecified: Secondary | ICD-10-CM | POA: Diagnosis not present

## 2022-10-19 DIAGNOSIS — I10 Essential (primary) hypertension: Secondary | ICD-10-CM | POA: Diagnosis not present

## 2022-10-19 DIAGNOSIS — E785 Hyperlipidemia, unspecified: Secondary | ICD-10-CM | POA: Diagnosis not present

## 2022-11-25 DIAGNOSIS — M13861 Other specified arthritis, right knee: Secondary | ICD-10-CM | POA: Diagnosis not present

## 2022-11-25 DIAGNOSIS — E785 Hyperlipidemia, unspecified: Secondary | ICD-10-CM | POA: Diagnosis not present

## 2022-11-25 DIAGNOSIS — I10 Essential (primary) hypertension: Secondary | ICD-10-CM | POA: Diagnosis not present

## 2022-12-28 DIAGNOSIS — E785 Hyperlipidemia, unspecified: Secondary | ICD-10-CM | POA: Diagnosis not present

## 2022-12-28 DIAGNOSIS — I10 Essential (primary) hypertension: Secondary | ICD-10-CM | POA: Diagnosis not present

## 2023-01-28 DIAGNOSIS — E785 Hyperlipidemia, unspecified: Secondary | ICD-10-CM | POA: Diagnosis not present

## 2023-01-28 DIAGNOSIS — I10 Essential (primary) hypertension: Secondary | ICD-10-CM | POA: Diagnosis not present

## 2023-02-17 DIAGNOSIS — H401131 Primary open-angle glaucoma, bilateral, mild stage: Secondary | ICD-10-CM | POA: Diagnosis not present

## 2023-02-28 DIAGNOSIS — E785 Hyperlipidemia, unspecified: Secondary | ICD-10-CM | POA: Diagnosis not present

## 2023-02-28 DIAGNOSIS — I10 Essential (primary) hypertension: Secondary | ICD-10-CM | POA: Diagnosis not present

## 2023-03-30 DIAGNOSIS — I10 Essential (primary) hypertension: Secondary | ICD-10-CM | POA: Diagnosis not present

## 2023-03-30 DIAGNOSIS — E785 Hyperlipidemia, unspecified: Secondary | ICD-10-CM | POA: Diagnosis not present

## 2023-04-30 DIAGNOSIS — I10 Essential (primary) hypertension: Secondary | ICD-10-CM | POA: Diagnosis not present

## 2023-04-30 DIAGNOSIS — E785 Hyperlipidemia, unspecified: Secondary | ICD-10-CM | POA: Diagnosis not present

## 2023-05-10 DIAGNOSIS — M13861 Other specified arthritis, right knee: Secondary | ICD-10-CM | POA: Diagnosis not present

## 2023-05-10 DIAGNOSIS — E785 Hyperlipidemia, unspecified: Secondary | ICD-10-CM | POA: Diagnosis not present

## 2023-05-10 DIAGNOSIS — F411 Generalized anxiety disorder: Secondary | ICD-10-CM | POA: Diagnosis not present

## 2023-05-10 DIAGNOSIS — I10 Essential (primary) hypertension: Secondary | ICD-10-CM | POA: Diagnosis not present

## 2023-05-26 DIAGNOSIS — F411 Generalized anxiety disorder: Secondary | ICD-10-CM | POA: Diagnosis not present

## 2023-05-26 DIAGNOSIS — Z1331 Encounter for screening for depression: Secondary | ICD-10-CM | POA: Diagnosis not present

## 2023-05-26 DIAGNOSIS — M13861 Other specified arthritis, right knee: Secondary | ICD-10-CM | POA: Diagnosis not present

## 2023-05-26 DIAGNOSIS — Z1389 Encounter for screening for other disorder: Secondary | ICD-10-CM | POA: Diagnosis not present

## 2023-05-26 DIAGNOSIS — E785 Hyperlipidemia, unspecified: Secondary | ICD-10-CM | POA: Diagnosis not present

## 2023-05-26 DIAGNOSIS — I10 Essential (primary) hypertension: Secondary | ICD-10-CM | POA: Diagnosis not present

## 2023-06-26 DIAGNOSIS — E785 Hyperlipidemia, unspecified: Secondary | ICD-10-CM | POA: Diagnosis not present

## 2023-06-26 DIAGNOSIS — I10 Essential (primary) hypertension: Secondary | ICD-10-CM | POA: Diagnosis not present

## 2023-07-27 DIAGNOSIS — I10 Essential (primary) hypertension: Secondary | ICD-10-CM | POA: Diagnosis not present

## 2023-07-27 DIAGNOSIS — E785 Hyperlipidemia, unspecified: Secondary | ICD-10-CM | POA: Diagnosis not present

## 2023-08-24 DIAGNOSIS — E785 Hyperlipidemia, unspecified: Secondary | ICD-10-CM | POA: Diagnosis not present

## 2023-08-24 DIAGNOSIS — I10 Essential (primary) hypertension: Secondary | ICD-10-CM | POA: Diagnosis not present

## 2023-09-24 DIAGNOSIS — E785 Hyperlipidemia, unspecified: Secondary | ICD-10-CM | POA: Diagnosis not present

## 2023-09-24 DIAGNOSIS — I10 Essential (primary) hypertension: Secondary | ICD-10-CM | POA: Diagnosis not present

## 2023-09-27 DIAGNOSIS — H401131 Primary open-angle glaucoma, bilateral, mild stage: Secondary | ICD-10-CM | POA: Diagnosis not present

## 2023-10-24 DIAGNOSIS — I10 Essential (primary) hypertension: Secondary | ICD-10-CM | POA: Diagnosis not present

## 2023-10-24 DIAGNOSIS — E785 Hyperlipidemia, unspecified: Secondary | ICD-10-CM | POA: Diagnosis not present

## 2023-12-06 DIAGNOSIS — E785 Hyperlipidemia, unspecified: Secondary | ICD-10-CM | POA: Diagnosis not present

## 2023-12-06 DIAGNOSIS — F411 Generalized anxiety disorder: Secondary | ICD-10-CM | POA: Diagnosis not present

## 2023-12-06 DIAGNOSIS — I10 Essential (primary) hypertension: Secondary | ICD-10-CM | POA: Diagnosis not present

## 2024-01-05 DIAGNOSIS — I10 Essential (primary) hypertension: Secondary | ICD-10-CM | POA: Diagnosis not present

## 2024-01-05 DIAGNOSIS — E785 Hyperlipidemia, unspecified: Secondary | ICD-10-CM | POA: Diagnosis not present

## 2024-02-05 DIAGNOSIS — E785 Hyperlipidemia, unspecified: Secondary | ICD-10-CM | POA: Diagnosis not present

## 2024-02-05 DIAGNOSIS — I10 Essential (primary) hypertension: Secondary | ICD-10-CM | POA: Diagnosis not present

## 2024-03-07 DIAGNOSIS — I10 Essential (primary) hypertension: Secondary | ICD-10-CM | POA: Diagnosis not present

## 2024-03-07 DIAGNOSIS — E785 Hyperlipidemia, unspecified: Secondary | ICD-10-CM | POA: Diagnosis not present

## 2024-03-09 DIAGNOSIS — H02834 Dermatochalasis of left upper eyelid: Secondary | ICD-10-CM | POA: Diagnosis not present

## 2024-03-09 DIAGNOSIS — H02831 Dermatochalasis of right upper eyelid: Secondary | ICD-10-CM | POA: Diagnosis not present

## 2024-03-09 DIAGNOSIS — H40013 Open angle with borderline findings, low risk, bilateral: Secondary | ICD-10-CM | POA: Diagnosis not present

## 2024-03-09 DIAGNOSIS — H2513 Age-related nuclear cataract, bilateral: Secondary | ICD-10-CM | POA: Diagnosis not present

## 2024-03-17 DIAGNOSIS — L821 Other seborrheic keratosis: Secondary | ICD-10-CM | POA: Diagnosis not present

## 2024-03-17 DIAGNOSIS — L814 Other melanin hyperpigmentation: Secondary | ICD-10-CM | POA: Diagnosis not present

## 2024-03-17 DIAGNOSIS — L570 Actinic keratosis: Secondary | ICD-10-CM | POA: Diagnosis not present

## 2024-03-17 DIAGNOSIS — M71342 Other bursal cyst, left hand: Secondary | ICD-10-CM | POA: Diagnosis not present

## 2024-04-06 DIAGNOSIS — I10 Essential (primary) hypertension: Secondary | ICD-10-CM | POA: Diagnosis not present

## 2024-04-06 DIAGNOSIS — H2511 Age-related nuclear cataract, right eye: Secondary | ICD-10-CM | POA: Diagnosis not present

## 2024-04-06 DIAGNOSIS — E785 Hyperlipidemia, unspecified: Secondary | ICD-10-CM | POA: Diagnosis not present

## 2024-04-10 NOTE — H&P (Signed)
 Surgical History & Physical  Patient Name: Johnny Gates  DOB: [birth year]  Surgery: Cataract extraction with intraocular lens implant phacoemulsification; Right Eye Surgeon: Lynwood Hermann MD Surgery Date: 04/14/2024 Pre-Op Date: 03/09/2024  HPI: A 69 Yr. old male patient 1. The patient is a new patient here today for a Cataract Evaluation. The patient complains of difficulty when seeing street signs, which began 1 year ago. Both eyes are affected. The episode is gradual. The patient describes foggy and hazy symptoms affecting their eyes/vision. This is negatively affecting the patient's quality of life and the patient is unable to function adequately in life with the current level of vision. HPI was performed by Lynwood Hermann .  Medical History: Cataracts  Review of Systems Negative Allergic/Immunologic Negative Cardiovascular Negative Constitutional Negative Ear, Nose, Mouth & Throat Negative Endocrine Negative Eyes Negative Gastrointestinal Negative Genitourinary Negative Hematologic/Lymphatic Negative Integumentary Negative Musculoskeletal Negative Neurological Negative Psychiatry Negative Respiratory  Social Never smoked  Medication Prednisolone-moxiflox-bromfen,  Alprazolam  Sx/Procedures None  Drug Allergies  NKDA  History & Physical: Heent: cataracts NECK: supple without bruits LUNGS: lungs clear to auscultation CV: regular rate and rhythm Abdomen: soft and non-tender  Impression & Plan: Assessment: 1.  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  DERMATOCHALASIS, no surgery; Right Upper Lid, Left Upper Lid (H02.831, H02.834) 4.  BLEPHARITIS; Right Upper Lid, Right Lower Lid, Left Upper Lid, Left Lower Lid (H01.001, H01.002,H01.004,H01.005) 5.  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  Cataract accounts for the patient's decreased vision. This visual impairment is not  correctable with a tolerable change in glasses or contact lenses. Cataract surgery with an implantation of a new lens should significantly improve the visual and functional status of the patient. Recommend phacoemulsification with intraocular lens. Discussed all risks, benefits, alternatives, and potential complications. Discussed the procedures and recovery. The patient desires to have surgery. A-scan/Biometry ordered and will be performed for intraocular lens calculations. The surgery will be performed in order to improve vision for driving, reading, and for eye examinations. Recommend Dextenza for post-operative pain and inflammation. Educational materials provided: Cataract. History of corneal refractive Surgery: None History of Previous Ocular Surgery (PPV, other): None History of ocular trauma: None Use of Eye Pressure Lowering Drops: None Patient is a current contact lens wearer - schedule for repeat measurements after 7 day contact lens holiday Pupil Status: Dilates poorly - shugarcaine or Lidocaine+Omidira by protocol, Malyugin Ring Right Eye first.. Refractive Goal: [place] Standard Lens: DIB00  2.  Based on cup-to-disc ratio. Gonio shows open angles. OCT rNFL shows borderline OD, thinning OS. Will need full workup after cataract surgery. Detailed discussion about glaucoma today including importance of maintaining good follow up and following treatment plan, and the possibility of irreversible blindness as part of this disease process.  3.  Asymptomatic, recommend observation for now. Findings, prognosis and treatment options reviewed.  4.  Blepharitis is present - recommend regular lid cleaning.  5.  Asymptomatic.

## 2024-04-11 ENCOUNTER — Encounter (HOSPITAL_COMMUNITY)
Admission: RE | Admit: 2024-04-11 | Discharge: 2024-04-11 | Disposition: A | Discharge status: Home / Self Care | Source: Ambulatory Visit | Attending: Ophthalmology | Admitting: Ophthalmology

## 2024-04-14 ENCOUNTER — Encounter (HOSPITAL_COMMUNITY)
Admission: RE | Disposition: A | Discharge status: Home / Self Care | Payer: Self-pay | Source: Home / Self Care | Attending: Ophthalmology

## 2024-04-14 ENCOUNTER — Ambulatory Visit (HOSPITAL_COMMUNITY)

## 2024-04-14 ENCOUNTER — Encounter (HOSPITAL_COMMUNITY): Payer: Self-pay | Admitting: Ophthalmology

## 2024-04-14 ENCOUNTER — Ambulatory Visit (HOSPITAL_COMMUNITY)
Admission: RE | Admit: 2024-04-14 | Discharge: 2024-04-14 | Disposition: A | Discharge status: Home / Self Care | Attending: Ophthalmology | Admitting: Ophthalmology

## 2024-04-14 DIAGNOSIS — H0100B Unspecified blepharitis left eye, upper and lower eyelids: Secondary | ICD-10-CM | POA: Diagnosis not present

## 2024-04-14 DIAGNOSIS — H2511 Age-related nuclear cataract, right eye: Secondary | ICD-10-CM | POA: Diagnosis not present

## 2024-04-14 DIAGNOSIS — H2181 Floppy iris syndrome: Secondary | ICD-10-CM | POA: Diagnosis not present

## 2024-04-14 DIAGNOSIS — H40013 Open angle with borderline findings, low risk, bilateral: Secondary | ICD-10-CM | POA: Diagnosis not present

## 2024-04-14 DIAGNOSIS — H2513 Age-related nuclear cataract, bilateral: Secondary | ICD-10-CM | POA: Insufficient documentation

## 2024-04-14 DIAGNOSIS — H0100A Unspecified blepharitis right eye, upper and lower eyelids: Secondary | ICD-10-CM | POA: Insufficient documentation

## 2024-04-14 DIAGNOSIS — H11823 Conjunctivochalasis, bilateral: Secondary | ICD-10-CM | POA: Diagnosis not present

## 2024-04-14 DIAGNOSIS — I10 Essential (primary) hypertension: Secondary | ICD-10-CM

## 2024-04-14 HISTORY — PX: CATARACT EXTRACTION W/PHACO: SHX586

## 2024-04-14 SURGERY — PHACOEMULSIFICATION, CATARACT, WITH IOL INSERTION
Anesthesia: Monitor Anesthesia Care | Site: Eye | Laterality: Right

## 2024-04-14 MED ORDER — PHENYLEPHRINE HCL 2.5 % OP SOLN
1.0000 [drp] | OPHTHALMIC | Status: AC | PRN
  Administered 2024-04-14 (×3): 1 [drp] via OPHTHALMIC

## 2024-04-14 MED ORDER — SODIUM HYALURONATE 10 MG/ML IO SOLUTION
PREFILLED_SYRINGE | INTRAOCULAR | Status: DC | PRN
  Administered 2024-04-14: .85 mL via INTRAOCULAR

## 2024-04-14 MED ORDER — MIDAZOLAM HCL (PF) 2 MG/2ML IJ SOLN
INTRAMUSCULAR | Status: DC | PRN
  Administered 2024-04-14: 2 mg via INTRAVENOUS

## 2024-04-14 MED ORDER — PHENYLEPHRINE-KETOROLAC 1-0.3 % IO SOLN
INTRAOCULAR | Status: DC | PRN
  Administered 2024-04-14: 500 mL via OPHTHALMIC

## 2024-04-14 MED ORDER — LACTATED RINGERS IV SOLN: INTRAVENOUS | Status: DC

## 2024-04-14 MED ORDER — LIDOCAINE HCL 3.5 % OP GEL
1.0000 | Freq: Once | OPHTHALMIC | Status: AC
  Administered 2024-04-14: 1 via OPHTHALMIC

## 2024-04-14 MED ORDER — TETRACAINE HCL 0.5 % OP SOLN
1.0000 [drp] | OPHTHALMIC | Status: AC | PRN
  Administered 2024-04-14 (×3): 1 [drp] via OPHTHALMIC

## 2024-04-14 MED ORDER — BSS IO SOLN
INTRAOCULAR | Status: DC | PRN
Start: 2024-04-14 — End: 2024-04-14
  Administered 2024-04-14: 15 mL via INTRAOCULAR

## 2024-04-14 MED ORDER — SODIUM HYALURONATE 23MG/ML IO SOSY
PREFILLED_SYRINGE | INTRAOCULAR | Status: DC | PRN
  Administered 2024-04-14: .6 mL via INTRAOCULAR

## 2024-04-14 MED ORDER — TROPICAMIDE 1 % OP SOLN
1.0000 [drp] | OPHTHALMIC | Status: AC | PRN
  Administered 2024-04-14 (×3): 1 [drp] via OPHTHALMIC

## 2024-04-14 MED ORDER — LIDOCAINE HCL (PF) 1 % IJ SOLN
INTRAMUSCULAR | Status: DC | PRN
  Administered 2024-04-14: 2 mL

## 2024-04-14 MED ORDER — MIDAZOLAM HCL 2 MG/2ML IJ SOLN
INTRAMUSCULAR | Status: AC
  Filled 2024-04-14: qty 2

## 2024-04-14 MED ORDER — POVIDONE-IODINE 5 % OP SOLN
OPHTHALMIC | Status: DC | PRN
  Administered 2024-04-14: 1 via OPHTHALMIC

## 2024-04-14 MED ORDER — SODIUM CHLORIDE 0.9% FLUSH
INTRAVENOUS | Status: DC | PRN
  Administered 2024-04-14: 10 mL via INTRAVENOUS

## 2024-04-14 MED ORDER — STERILE WATER FOR IRRIGATION IR SOLN
Status: DC | PRN
  Administered 2024-04-14: 1

## 2024-04-14 MED ORDER — MOXIFLOXACIN HCL 5 MG/ML IO SOLN
INTRAOCULAR | Status: DC | PRN
Start: 2024-04-14 — End: 2024-04-14
  Administered 2024-04-14: .3 mL via INTRACAMERAL

## 2024-04-14 SURGICAL SUPPLY — 12 items
CLOTH BEACON ORANGE TIMEOUT ST (SAFETY) ×1 IMPLANT
EYE SHIELD UNIVERSAL CLEAR (GAUZE/BANDAGES/DRESSINGS) IMPLANT
FEE CATARACT SUITE SIGHTPATH (MISCELLANEOUS) ×1 IMPLANT
GLOVE BIOGEL PI IND STRL 7.0 (GLOVE) ×2 IMPLANT
LENS IOL TECNIS EYHANCE 22.0 (Intraocular Lens) IMPLANT
NDL HYPO 18GX1.5 BLUNT FILL (NEEDLE) ×1 IMPLANT
NEEDLE HYPO 18GX1.5 BLUNT FILL (NEEDLE) ×1 IMPLANT
PAD ARMBOARD POSITIONER FOAM (MISCELLANEOUS) ×1 IMPLANT
RING MALYGIN 7.0 (MISCELLANEOUS) IMPLANT
SYR TB 1ML LL NO SAFETY (SYRINGE) ×1 IMPLANT
TAPE SURG TRANSPORE 1 IN (GAUZE/BANDAGES/DRESSINGS) IMPLANT
WATER STERILE IRR 250ML POUR (IV SOLUTION) ×1 IMPLANT

## 2024-04-14 NOTE — Transfer of Care (Addendum)
 Immediate Anesthesia Transfer of Care Note  Patient: Johnny Gates  Procedure(s) Performed: PHACOEMULSIFICATION, CATARACT, WITH IOL INSERTION (Right: Eye)  Patient Location: Short Stay  Anesthesia Type:MAC  Level of Consciousness: awake and patient cooperative  Airway & Oxygen Therapy: Patient Spontanous Breathing  Post-op Assessment: Report given to RN and Post -op Vital signs reviewed and stable  Post vital signs: Reviewed and stable  Last Vitals:  Vitals Value Taken Time  BP 110/79 04/14/24   0948  Temp 36.6 04/14/24   0948  Pulse 57 04/14/24   0948  Resp 16 04/14/24   0948  SpO2 100% 04/14/24   0948    Last Pain:  Vitals:   04/14/24 0843  TempSrc: Oral  PainSc: 0-No pain      Patients Stated Pain Goal: 5 (04/14/24 0843)  Complications: No notable events documented.

## 2024-04-14 NOTE — Interval H&P Note (Signed)
 History and Physical Interval Note:  04/14/2024 9:20 AM  Johnny Gates  has presented today for surgery, with the diagnosis of nuclear sclerotic cataract, right eye.  The various methods of treatment have been discussed with the patient and family. After consideration of risks, benefits and other options for treatment, the patient has consented to  Procedure(s): PHACOEMULSIFICATION, CATARACT, WITH IOL INSERTION (Right) as a surgical intervention.  The patient's history has been reviewed, patient examined, no change in status, stable for surgery.  I have reviewed the patient's chart and labs.  Questions were answered to the patient's satisfaction.     HARRIE AGENT

## 2024-04-14 NOTE — Anesthesia Postprocedure Evaluation (Signed)
 Anesthesia Post Note  Patient: Johnny Gates  Procedure(s) Performed: PHACOEMULSIFICATION, CATARACT, WITH IOL INSERTION (Right: Eye)  Patient location during evaluation: PACU Anesthesia Type: MAC Level of consciousness: awake and alert Pain management: pain level controlled Vital Signs Assessment: post-procedure vital signs reviewed and stable Respiratory status: spontaneous breathing, nonlabored ventilation, respiratory function stable and patient connected to nasal cannula oxygen Cardiovascular status: stable and blood pressure returned to baseline Postop Assessment: no apparent nausea or vomiting Anesthetic complications: no   No notable events documented.   Last Vitals:  Vitals:   04/14/24 0843 04/14/24 0948  BP: (!) 147/93 110/79  Pulse: (!) 57 (!) 55  Resp: 20 16  Temp: 36.5 C 36.6 C  SpO2: 100% 100%    Last Pain:  Vitals:   04/14/24 0948  TempSrc: Oral  PainSc: 0-No pain                 Andrea Limes

## 2024-04-14 NOTE — Discharge Instructions (Addendum)
 Please discharge patient when stable, will follow up today with Dr. June Leap at the [facility] office immediately following discharge.  Leave shield in place until visit.  All paperwork with discharge instructions will be given at the office.  [facility] Address:  [address]  [place], [place] [address]

## 2024-04-14 NOTE — Op Note (Signed)
 Date of procedure: 04/14/24  Pre-operative diagnosis: Visually significant age-related nuclear cataract, Right Eye; Poor Dilation, Right Eye (H25.11)  Post-operative diagnosis: Visually significant age-related cataract, Right Eye; Intra-operative Floppy Iris Syndrome, Right Eye (H21.81)  Procedure: Removal of cataract via phacoemulsification and insertion of intra-ocular lens [employer] DIB00 +22.0D into the capsular bag of the Right Eye (CPT [phone])  Attending surgeon: Lynwood LABOR. Georgeanna Radziewicz, MD, MA  Anesthesia: MAC, Topical Akten  Complications: None  Estimated Blood Loss: <76mL (minimal)  Specimens: None  Implants: As above  Indications:  Visually significant cataract, Right Eye  Procedure:  The patient was seen and identified in the pre-operative area. The operative eye was identified and dilated.  The operative eye was marked.  Topical anesthesia was administered to the operative eye.     The patient was then to the operative suite and placed in the supine position.  A timeout was performed confirming the patient, procedure to be performed, and all other relevant information.   The patient's face was prepped and draped in the usual fashion for intra-ocular surgery.  A lid speculum was placed into the operative eye and the surgical microscope moved into place and focused.  Poor dilation of the iris was confirmed.  A superotemporal paracentesis was created using a 20 gauge paracentesis blade. Omidria was injected into the anterior chamber. Shugarcaine was injected into the anterior chamber.  Viscoelastic was injected into the anterior chamber.  A temporal clear-corneal main wound incision was created using a 2.76mm microkeratome.  A Malyugin ring was placed.  A continuous curvilinear capsulorrhexis was initiated using an irrigating cystitome and completed using capsulorrhexis forceps.  Hydrodissection and hydrodeliniation were performed.  Viscoelastic was injected into the anterior  chamber.  A phacoemulsification handpiece and a chopper as a second instrument were used to remove the nucleus and epinucleus. The irrigation/aspiration handpiece was used to remove any remaining cortical material.   The capsular bag was reinflated with viscoelastic, checked, and found to be intact.  The intraocular lens was inserted into the capsular bag and dialed into place using a Customer Service Manager.  The Malyugin ring was removed.  The irrigation/aspiration handpiece was used to remove any remaining viscoelastic.  The clear corneal wound and paracentesis wounds were then hydrated and checked with Weck-Cels to be watertight. 0.1mL of moxifloxacin was injected into the anterior chamber.  The lid-speculum and drape was removed, and the patient's face was cleaned with a wet and dry 4x4. A clear shield was taped over the eye. The patient was taken to the post-operative care unit in good condition, having tolerated the procedure well.  Post-Op Instructions: The patient will follow up at [facility] for a same day post-operative evaluation and will receive all other orders and instructions.

## 2024-04-14 NOTE — Anesthesia Preprocedure Evaluation (Signed)
 Anesthesia Evaluation  Patient identified by MRN, date of birth, ID band Patient awake    Reviewed: Allergy & Precautions, H&P , NPO status , Patient's Chart, lab work & pertinent test results  Airway Mallampati: II  TM Distance: >3 FB Neck ROM: Full    Dental no notable dental hx.    Pulmonary neg pulmonary ROS   Pulmonary exam normal breath sounds clear to auscultation       Cardiovascular hypertension, negative cardio ROS Normal cardiovascular exam Rhythm:Regular Rate:Normal     Neuro/Psych negative neurological ROS  negative psych ROS   GI/Hepatic negative GI ROS, Neg liver ROS,,,  Endo/Other  negative endocrine ROS    Renal/GU negative Renal ROS  negative genitourinary   Musculoskeletal negative musculoskeletal ROS (+)    Abdominal   Peds negative pediatric ROS (+)  Hematology negative hematology ROS (+)   Anesthesia Other Findings   Reproductive/Obstetrics negative OB ROS                              Anesthesia Physical Anesthesia Plan  ASA: 2  Anesthesia Plan: MAC   Post-op Pain Management:    Induction:   PONV Risk Score and Plan:   Airway Management Planned: Nasal Cannula  Additional Equipment:   Intra-op Plan:   Post-operative Plan:   Informed Consent: I have reviewed the patients History and Physical, chart, labs and discussed the procedure including the risks, benefits and alternatives for the proposed anesthesia with the patient or authorized representative who has indicated his/her understanding and acceptance.     Dental advisory given  Plan Discussed with: CRNA  Anesthesia Plan Comments:         Anesthesia Quick Evaluation

## 2024-04-14 NOTE — Anesthesia Procedure Notes (Signed)
 Date/Time: 04/14/2024 9:29 AM  Performed by: Para Jerelene CROME, CRNAOxygen Delivery Method: Nasal cannula

## 2024-04-15 ENCOUNTER — Encounter (HOSPITAL_COMMUNITY): Payer: Self-pay | Admitting: Ophthalmology

## 2024-04-27 DIAGNOSIS — H2512 Age-related nuclear cataract, left eye: Secondary | ICD-10-CM | POA: Diagnosis not present

## 2024-04-28 NOTE — H&P (Signed)
 Surgical History & Physical  Patient Name: Johnny Gates  DOB: 11-21-1945  Surgery: Cataract extraction with intraocular lens implant phacoemulsification; Left Eye Surgeon: Lynwood Hermann MD Surgery Date: 05/05/2024 Pre-Op Date: 04/17/2024  HPI: A 50 Yr. old male patient returning for 3 day post op OD/Pre OP OS. Pt sates: It feels fine. It felt like a 2x4 in there at first but not any more. It's doing fine. Pt admits compliance with eye drops. Patient also here for persistent blurry vision in the left eye. The blurry vision is constant and worsening. It has been present for several months. The patient has trouble with glare when driving at night. This is negatively affecting the patient's quality of life and the patient is unable to function adequately in life with the current level of vision. HPI Completed by Dr. Lynwood Hermann  Medical History: Cataracts  Review of Systems Negative Allergic/Immunologic Negative Cardiovascular Negative Constitutional Negative Ear, Nose, Mouth & Throat Negative Endocrine Negative Eyes Negative Gastrointestinal Negative Genitourinary Negative Hematologic/Lymphatic Negative Integumentary Negative Musculoskeletal Negative Neurological Negative Psychiatry Negative Respiratory  Social Never smoked   Medication Prednisolone-moxiflox-bromfen, Prednisolone-moxiflox-bromfen,  Alprazolam  Sx/Procedures Phaco c IOL OD  Drug Allergies  NKDA  History & Physical: Heent: cataract NECK: supple without bruits LUNGS: lungs clear to auscultation CV: regular rate and rhythm Abdomen: soft and non-tender  Impression & Plan: Assessment: 1.  CATARACT EXTRACTION STATUS; Right Eye (Z98.41) 2.  NUCLEAR SCLEROSIS AGE RELATED; , Left Eye (H25.12)  Plan: 1.  3 days after cataract surgery. Doing well with improved vision and normal eye pressure. Call with any problems or concerns. Continue Pred-Moxi-Brom 3x/day for 4 days, then 2x/day for 3 more  weeks.,  2.  Cataract accounts for the patient's decreased vision. This visual impairment is not correctable with a tolerable change in glasses or contact lenses. Cataract surgery with an implantation of a new lens should significantly improve the visual and functional status of the patient. Recommend phacoemulsification with intraocular lens. Discussed all risks, benefits, alternatives, and potential complications. Discussed the procedures and recovery. The patient desires to have surgery. A-scan/Biometry ordered and will be performed for intraocular lens calculations. The surgery will be performed in order to improve vision for driving, reading, and for eye examinations. Recommend Dextenza for post-operative pain and inflammation. Educational materials provided: Cataract. History of corneal refractive Surgery: None History of Previous Ocular Surgery (PPV, other): None History of ocular trauma: None Use of Eye Pressure Lowering Drops: None Pupil Status: Dilates poorly - shugarcaine or Lidocaine+Omidira by protocol, Malyugin Ring left eye. Refractive Goal: [place] Standard Lens: DIB00

## 2024-05-03 ENCOUNTER — Encounter (HOSPITAL_COMMUNITY)
Admission: RE | Admit: 2024-05-03 | Discharge: 2024-05-03 | Disposition: A | Discharge status: Home / Self Care | Source: Ambulatory Visit | Attending: Ophthalmology | Admitting: Ophthalmology

## 2024-05-05 ENCOUNTER — Encounter (HOSPITAL_COMMUNITY): Payer: Self-pay | Admitting: Ophthalmology

## 2024-05-05 ENCOUNTER — Ambulatory Visit (HOSPITAL_COMMUNITY): Admitting: Anesthesiology

## 2024-05-05 ENCOUNTER — Encounter (HOSPITAL_COMMUNITY)
Admission: RE | Disposition: A | Discharge status: Home / Self Care | Payer: Self-pay | Source: Home / Self Care | Attending: Ophthalmology

## 2024-05-05 ENCOUNTER — Ambulatory Visit (HOSPITAL_COMMUNITY)
Admission: RE | Admit: 2024-05-05 | Discharge: 2024-05-05 | Disposition: A | Discharge status: Home / Self Care | Attending: Ophthalmology | Admitting: Ophthalmology

## 2024-05-05 DIAGNOSIS — Z9841 Cataract extraction status, right eye: Secondary | ICD-10-CM | POA: Diagnosis not present

## 2024-05-05 DIAGNOSIS — H2512 Age-related nuclear cataract, left eye: Secondary | ICD-10-CM | POA: Diagnosis not present

## 2024-05-05 DIAGNOSIS — I10 Essential (primary) hypertension: Secondary | ICD-10-CM

## 2024-05-05 DIAGNOSIS — H259 Unspecified age-related cataract: Secondary | ICD-10-CM | POA: Diagnosis not present

## 2024-05-05 DIAGNOSIS — H2181 Floppy iris syndrome: Secondary | ICD-10-CM | POA: Insufficient documentation

## 2024-05-05 DIAGNOSIS — Z79899 Other long term (current) drug therapy: Secondary | ICD-10-CM | POA: Insufficient documentation

## 2024-05-05 HISTORY — PX: CATARACT EXTRACTION W/PHACO: SHX586

## 2024-05-05 SURGERY — PHACOEMULSIFICATION, CATARACT, WITH IOL INSERTION
Anesthesia: Monitor Anesthesia Care | Site: Eye | Laterality: Left

## 2024-05-05 MED ORDER — PHENYLEPHRINE-KETOROLAC 1-0.3 % IO SOLN
INTRAOCULAR | Status: DC | PRN
  Administered 2024-05-05: 500 mL via OPHTHALMIC

## 2024-05-05 MED ORDER — MOXIFLOXACIN HCL 5 MG/ML IO SOLN
INTRAOCULAR | Status: DC | PRN
  Administered 2024-05-05: .2 mL via INTRACAMERAL

## 2024-05-05 MED ORDER — LIDOCAINE HCL 3.5 % OP GEL
1.0000 | Freq: Once | OPHTHALMIC | Status: AC
  Administered 2024-05-05: 1 via OPHTHALMIC

## 2024-05-05 MED ORDER — SODIUM CHLORIDE 0.9% FLUSH
INTRAVENOUS | Status: DC | PRN
  Administered 2024-05-05 (×2): 5 mL via INTRAVENOUS

## 2024-05-05 MED ORDER — LIDOCAINE HCL (PF) 1 % IJ SOLN
INTRAMUSCULAR | Status: DC | PRN
  Administered 2024-05-05: 1 mL

## 2024-05-05 MED ORDER — TETRACAINE HCL 0.5 % OP SOLN
1.0000 [drp] | OPHTHALMIC | Status: AC | PRN
  Administered 2024-05-05 (×3): 1 [drp] via OPHTHALMIC

## 2024-05-05 MED ORDER — LACTATED RINGERS IV SOLN: INTRAVENOUS | Status: DC

## 2024-05-05 MED ORDER — PHENYLEPHRINE HCL 2.5 % OP SOLN
1.0000 [drp] | OPHTHALMIC | Status: AC | PRN
  Administered 2024-05-05 (×3): 1 [drp] via OPHTHALMIC

## 2024-05-05 MED ORDER — POVIDONE-IODINE 5 % OP SOLN
OPHTHALMIC | Status: DC | PRN
  Administered 2024-05-05: 1 via OPHTHALMIC

## 2024-05-05 MED ORDER — TROPICAMIDE 1 % OP SOLN
1.0000 [drp] | OPHTHALMIC | Status: AC | PRN
  Administered 2024-05-05 (×3): 1 [drp] via OPHTHALMIC

## 2024-05-05 MED ORDER — MIDAZOLAM HCL 2 MG/2ML IJ SOLN
INTRAMUSCULAR | Status: AC
  Filled 2024-05-05: qty 2

## 2024-05-05 MED ORDER — MIDAZOLAM HCL (PF) 2 MG/2ML IJ SOLN
INTRAMUSCULAR | Status: DC | PRN
  Administered 2024-05-05 (×2): 1 mg via INTRAVENOUS

## 2024-05-05 MED ORDER — SODIUM HYALURONATE 23MG/ML IO SOSY
PREFILLED_SYRINGE | INTRAOCULAR | Status: DC | PRN
Start: 2024-05-05 — End: 2024-05-05
  Administered 2024-05-05: .6 mL via INTRAOCULAR

## 2024-05-05 MED ORDER — SODIUM HYALURONATE 10 MG/ML IO SOLUTION
PREFILLED_SYRINGE | INTRAOCULAR | Status: DC | PRN
Start: 2024-05-05 — End: 2024-05-05
  Administered 2024-05-05: .85 mL via INTRAOCULAR

## 2024-05-05 MED ORDER — STERILE WATER FOR IRRIGATION IR SOLN
Status: DC | PRN
  Administered 2024-05-05: 1

## 2024-05-05 MED ORDER — BSS IO SOLN
INTRAOCULAR | Status: DC | PRN
  Administered 2024-05-05: 15 mL via INTRAOCULAR

## 2024-05-05 SURGICAL SUPPLY — 12 items
CLOTH BEACON ORANGE TIMEOUT ST (SAFETY) ×1 IMPLANT
EYE SHIELD UNIVERSAL CLEAR (GAUZE/BANDAGES/DRESSINGS) IMPLANT
FEE CATARACT SUITE SIGHTPATH (MISCELLANEOUS) ×1 IMPLANT
GLOVE BIOGEL PI IND STRL 7.0 (GLOVE) ×2 IMPLANT
LENS IOL TECNIS EYHANCE 21.5 (Intraocular Lens) IMPLANT
NDL HYPO 18GX1.5 BLUNT FILL (NEEDLE) ×1 IMPLANT
NEEDLE HYPO 18GX1.5 BLUNT FILL (NEEDLE) ×1 IMPLANT
PAD ARMBOARD POSITIONER FOAM (MISCELLANEOUS) ×1 IMPLANT
RING MALYGIN 7.0 (MISCELLANEOUS) IMPLANT
SYR TB 1ML LL NO SAFETY (SYRINGE) ×1 IMPLANT
TAPE SURG TRANSPORE 1 IN (GAUZE/BANDAGES/DRESSINGS) IMPLANT
WATER STERILE IRR 250ML POUR (IV SOLUTION) ×1 IMPLANT

## 2024-05-05 NOTE — Anesthesia Procedure Notes (Signed)
 Date/Time: 05/05/2024 11:19 AM  Performed by: Para Jerelene CROME, CRNAOxygen Delivery Method: Nasal cannula

## 2024-05-05 NOTE — Anesthesia Preprocedure Evaluation (Signed)
 Anesthesia Evaluation  Patient identified by MRN, date of birth, ID band Patient awake    Reviewed: Allergy & Precautions, H&P , NPO status , Patient's Chart, lab work & pertinent test results  Airway Mallampati: II  TM Distance: >3 FB Neck ROM: Full    Dental no notable dental hx. (+) Dental Advisory Given, Teeth Intact   Pulmonary neg pulmonary ROS   Pulmonary exam normal breath sounds clear to auscultation       Cardiovascular hypertension, Normal cardiovascular exam Rhythm:Regular Rate:Normal     Neuro/Psych negative neurological ROS  negative psych ROS   GI/Hepatic negative GI ROS, Neg liver ROS,,,  Endo/Other  negative endocrine ROS    Renal/GU negative Renal ROS  negative genitourinary   Musculoskeletal negative musculoskeletal ROS (+)    Abdominal   Peds negative pediatric ROS (+)  Hematology negative hematology ROS (+)   Anesthesia Other Findings   Reproductive/Obstetrics negative OB ROS                              Anesthesia Physical Anesthesia Plan  ASA: 2  Anesthesia Plan: MAC   Post-op Pain Management: Minimal or no pain anticipated   Induction:   PONV Risk Score and Plan: Midazolam   Airway Management Planned: Nasal Cannula and Natural Airway  Additional Equipment: None  Intra-op Plan:   Post-operative Plan:   Informed Consent: I have reviewed the patients History and Physical, chart, labs and discussed the procedure including the risks, benefits and alternatives for the proposed anesthesia with the patient or authorized representative who has indicated his/her understanding and acceptance.     Dental advisory given  Plan Discussed with: CRNA  Anesthesia Plan Comments:          Anesthesia Quick Evaluation

## 2024-05-05 NOTE — Interval H&P Note (Signed)
 History and Physical Interval Note:  05/05/2024 11:11 AM  Johnny Gates  has presented today for surgery, with the diagnosis of nuclear sclerotic cataract, left eye.  The various methods of treatment have been discussed with the patient and family. After consideration of risks, benefits and other options for treatment, the patient has consented to  Procedure(s): PHACOEMULSIFICATION, CATARACT, WITH IOL INSERTION (Left) as a surgical intervention.  The patient's history has been reviewed, patient examined, no change in status, stable for surgery.  I have reviewed the patient's chart and labs.  Questions were answered to the patient's satisfaction.     HARRIE AGENT

## 2024-05-05 NOTE — Transfer of Care (Addendum)
 Immediate Anesthesia Transfer of Care Note  Patient: Johnny Gates  Procedure(s) Performed: PHACOEMULSIFICATION, CATARACT, WITH IOL INSERTION (Left: Eye)  Patient Location: Short Stay  Anesthesia Type:MAC  Level of Consciousness: awake, alert , and patient cooperative  Airway & Oxygen Therapy: Patient Spontanous Breathing  Post-op Assessment: Report given to RN and Post -op Vital signs reviewed and stable  Post vital signs: Reviewed and stable  Last Vitals:  Vitals Value Taken Time  BP 146/78 05/05/24   11:41  Temp 36.5 05/05/24   11:41  Pulse 57 05/05/24   11:41  Resp 12 05/05/24   11:41  SpO2 100% 05/05/24   11:41    Last Pain:  Vitals:   05/05/24 1019  TempSrc: Oral  PainSc: 0-No pain      Patients Stated Pain Goal: 8 (05/05/24 1019)  Complications: No notable events documented.

## 2024-05-05 NOTE — Anesthesia Postprocedure Evaluation (Signed)
 Anesthesia Post Note  Patient: Johnny Gates  Procedure(s) Performed: PHACOEMULSIFICATION, CATARACT, WITH IOL INSERTION (Left: Eye)  Patient location during evaluation: Phase II Anesthesia Type: MAC Level of consciousness: awake and alert Pain management: pain level controlled Vital Signs Assessment: post-procedure vital signs reviewed and stable Respiratory status: spontaneous breathing, nonlabored ventilation and respiratory function stable Cardiovascular status: stable Anesthetic complications: no   There were no known notable events for this encounter.   Last Vitals:  Vitals:   05/05/24 1019 05/05/24 1141  BP: 130/80 (!) 146/78  Pulse:  (!) 56  Resp: 16 12  Temp: 36.9 C 36.5 C  SpO2: 100% 100%    Last Pain:  Vitals:   05/05/24 1141  TempSrc: Oral  PainSc: 0-No pain                 [employer] Flight L Hadli Vandemark

## 2024-05-05 NOTE — Op Note (Signed)
 Date of procedure: 05/05/24  Pre-operative diagnosis: Visually significant age-related cataract, Left Eye; Poor dilation, Left eye (H25.?2)   Post-operative diagnosis: Visually significant age-related cataract, Left Eye; Intra-operative Floppy Iris Syndrome, Left Eye (H21.81)  Procedure: Complex removal of cataract via phacoemulsification and insertion of intra-ocular lens Johnson and Johnson DIB00 +21.5D into the capsular bag of the Left Eye (CPT [phone])  Attending surgeon: Lynwood LABOR. Zaccary Creech, MD, MA  Anesthesia: MAC, Topical Akten   Complications: None  Estimated Blood Loss: <38mL (minimal)  Specimens: None  Implants: As above  Indications:  Visually significant cataract, Left Eye  Procedure:  The patient was seen and identified in the pre-operative area. The operative eye was identified and dilated.  The operative eye was marked.  Topical anesthesia was administered to the operative eye.     The patient was then to the operative suite and placed in the supine position.  A timeout was performed confirming the patient, procedure to be performed, and all other relevant information.   The patient's face was prepped and draped in the usual fashion for intra-ocular surgery.  A lid speculum was placed into the operative eye and the surgical microscope moved into place and focused.  Poor dilation of the iris was confirmed.  An inferotemporal paracentesis was created using a 20 gauge paracentesis blade. Omidria  was injected into the anterior chamber. Shugarcaine was injected into the anterior chamber.  Viscoelastic was injected into the anterior chamber.  A temporal clear-corneal main wound incision was created using a 2.16mm microkeratome.  A Malyugin ring was placed.  A continuous curvilinear capsulorrhexis was initiated using an irrigating cystitome and completed using capsulorrhexis forceps.  Hydrodissection and hydrodeliniation were performed.  Viscoelastic was injected into the anterior chamber.   A phacoemulsification handpiece and a chopper as a second instrument were used to remove the nucleus and epinucleus. The irrigation/aspiration handpiece was used to remove any remaining cortical material.   The capsular bag was reinflated with viscoelastic, checked, and found to be intact.  The intraocular lens was inserted into the capsular bag and dialed into place using a Customer Service Manager. The Malyugin ring was removed.  The irrigation/aspiration handpiece was used to remove any remaining viscoelastic.  The clear corneal wound and paracentesis wounds were then hydrated and checked with Weck-Cels to be watertight. 0.1mL of Moxifloxacin  was injected into the anterior chamber. The lid-speculum and drape was removed, and the patient's face was cleaned with a wet and dry 4x4. A clear shield was taped over the eye. The patient was taken to the post-operative care unit in good condition, having tolerated the procedure well.  Post-Op Instructions: The patient will follow up at [facility] for a same day post-operative evaluation and will receive all other orders and instructions.

## 2024-05-05 NOTE — Discharge Instructions (Addendum)
 Please discharge patient when stable, will follow up today with Dr. June Leap at the [facility] office immediately following discharge.  Leave shield in place until visit.  All paperwork with discharge instructions will be given at the office.  [facility] Address:  [address]  [place], [place] [address]

## 2024-05-07 DIAGNOSIS — I10 Essential (primary) hypertension: Secondary | ICD-10-CM | POA: Diagnosis not present

## 2024-05-07 DIAGNOSIS — E785 Hyperlipidemia, unspecified: Secondary | ICD-10-CM | POA: Diagnosis not present
